# Patient Record
Sex: Male | Born: 1940
Health system: Southern US, Community
[De-identification: ages and names within clinical notes are randomized; demographics above are authoritative.]

## PROBLEM LIST (undated history)

## (undated) DIAGNOSIS — K219 Gastro-esophageal reflux disease without esophagitis: Secondary | ICD-10-CM

## (undated) DIAGNOSIS — Z972 Presence of dental prosthetic device (complete) (partial): Secondary | ICD-10-CM

## (undated) DIAGNOSIS — K635 Polyp of colon: Secondary | ICD-10-CM

## (undated) DIAGNOSIS — R1310 Dysphagia, unspecified: Secondary | ICD-10-CM

## (undated) DIAGNOSIS — K449 Diaphragmatic hernia without obstruction or gangrene: Secondary | ICD-10-CM

## (undated) DIAGNOSIS — N200 Calculus of kidney: Secondary | ICD-10-CM

## (undated) DIAGNOSIS — Z1211 Encounter for screening for malignant neoplasm of colon: Secondary | ICD-10-CM

## (undated) DIAGNOSIS — R1032 Left lower quadrant pain: Secondary | ICD-10-CM

## (undated) DIAGNOSIS — I2510 Atherosclerotic heart disease of native coronary artery without angina pectoris: Secondary | ICD-10-CM

## (undated) DIAGNOSIS — N486 Induration penis plastica: Secondary | ICD-10-CM

## (undated) DIAGNOSIS — R011 Cardiac murmur, unspecified: Secondary | ICD-10-CM

## (undated) DIAGNOSIS — E785 Hyperlipidemia, unspecified: Secondary | ICD-10-CM

## (undated) DIAGNOSIS — M109 Gout, unspecified: Secondary | ICD-10-CM

## (undated) DIAGNOSIS — I10 Essential (primary) hypertension: Secondary | ICD-10-CM

## (undated) DIAGNOSIS — Z87442 Personal history of urinary calculi: Secondary | ICD-10-CM

## (undated) DIAGNOSIS — Z86010 Personal history of colonic polyps: Secondary | ICD-10-CM

## (undated) DIAGNOSIS — H269 Unspecified cataract: Secondary | ICD-10-CM

## (undated) HISTORY — PX: COLONOSCOPY, ESOPHAGOGASTRODUODENOSCOPY (EGD) AND ESOPHAGEAL DILATION: SHX5781

---

## 2005-05-31 ENCOUNTER — Ambulatory Visit: Payer: Self-pay | Admitting: Unknown Physician Specialty

## 2006-11-23 ENCOUNTER — Emergency Department: Payer: Self-pay | Admitting: Emergency Medicine

## 2006-12-17 ENCOUNTER — Ambulatory Visit: Payer: Self-pay | Admitting: Internal Medicine

## 2009-01-09 ENCOUNTER — Ambulatory Visit: Payer: Self-pay | Admitting: Family

## 2009-01-11 ENCOUNTER — Ambulatory Visit: Payer: Self-pay | Admitting: Family

## 2009-01-26 ENCOUNTER — Ambulatory Visit: Payer: Self-pay | Admitting: Urology

## 2009-02-09 ENCOUNTER — Ambulatory Visit: Payer: Self-pay | Admitting: Urology

## 2009-02-21 ENCOUNTER — Ambulatory Visit: Payer: Self-pay | Admitting: Urology

## 2009-11-22 IMAGING — CR DG ABDOMEN 1V
1 series · 2 of 2 positions shown · non-contrast
Comparison: none

REASON FOR EXAM: Nephrolithiasis
COMMENTS:

[Series 1: view not recorded · 0.17mm/px · 2 of 2 slices shown]
[im 1/2]
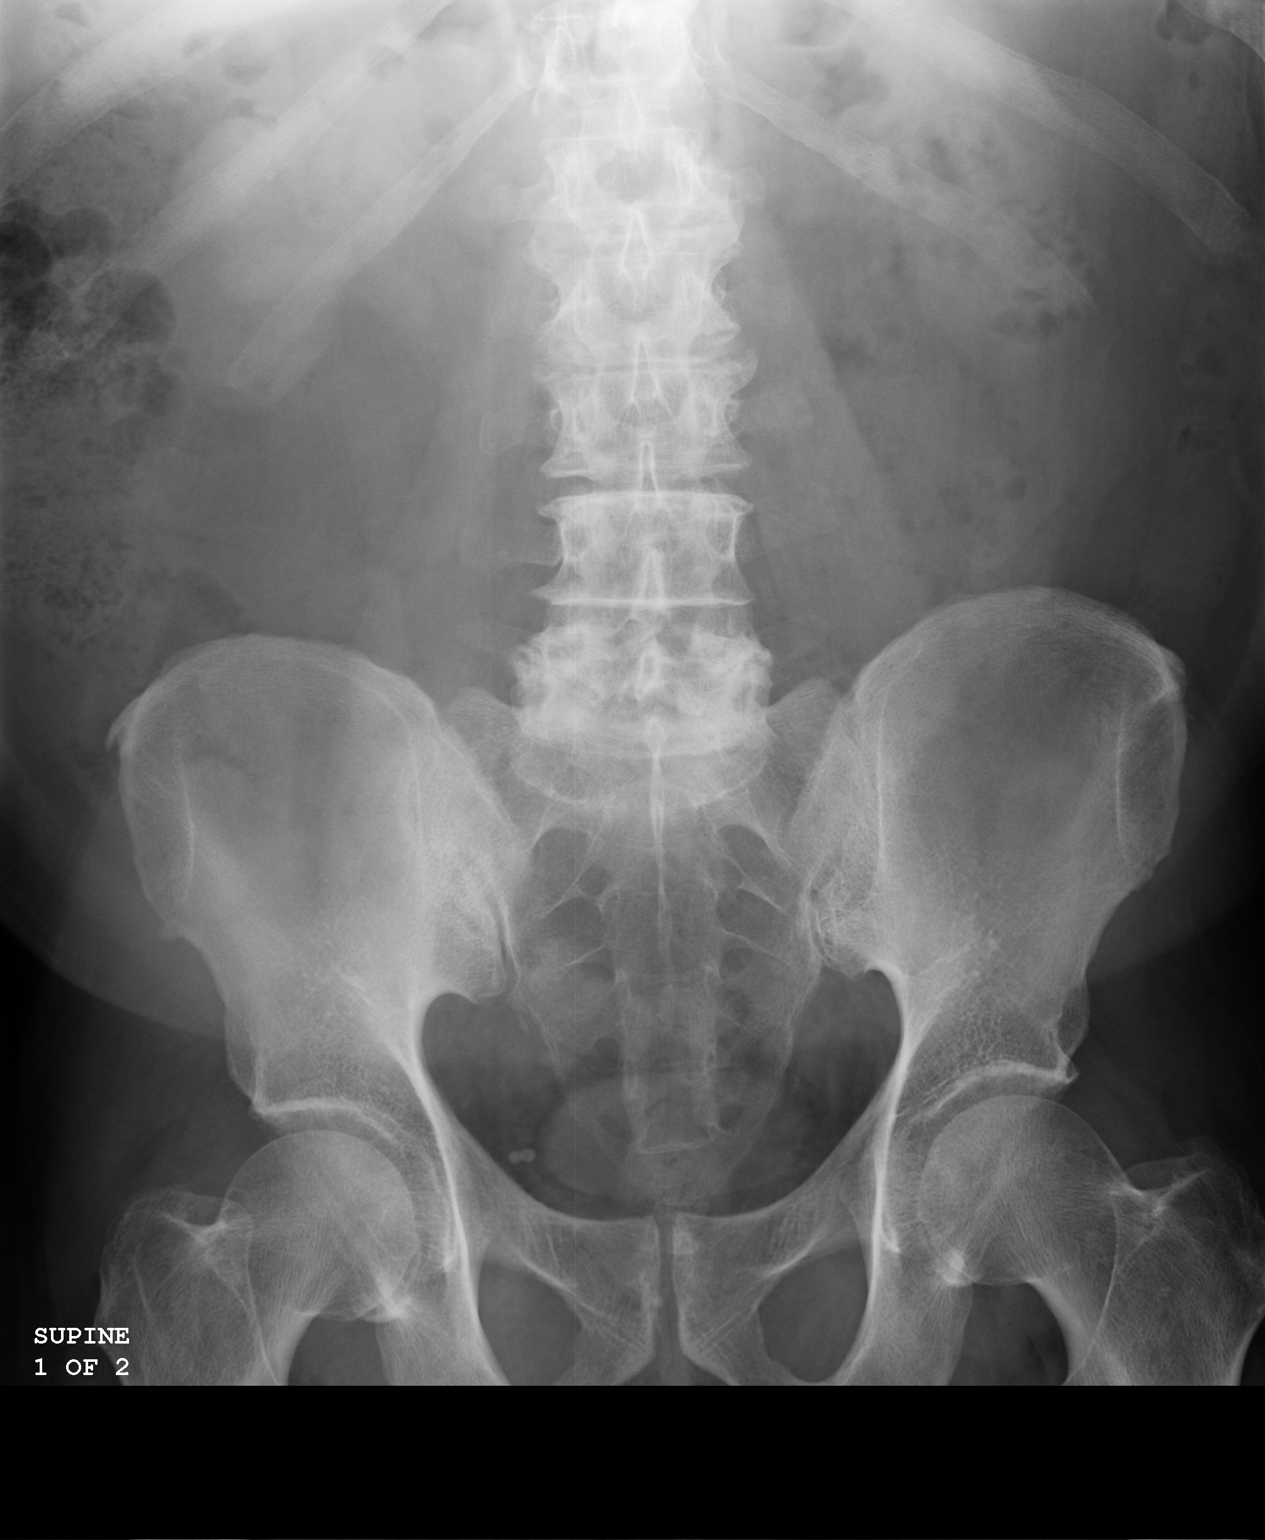
[im 2/2]
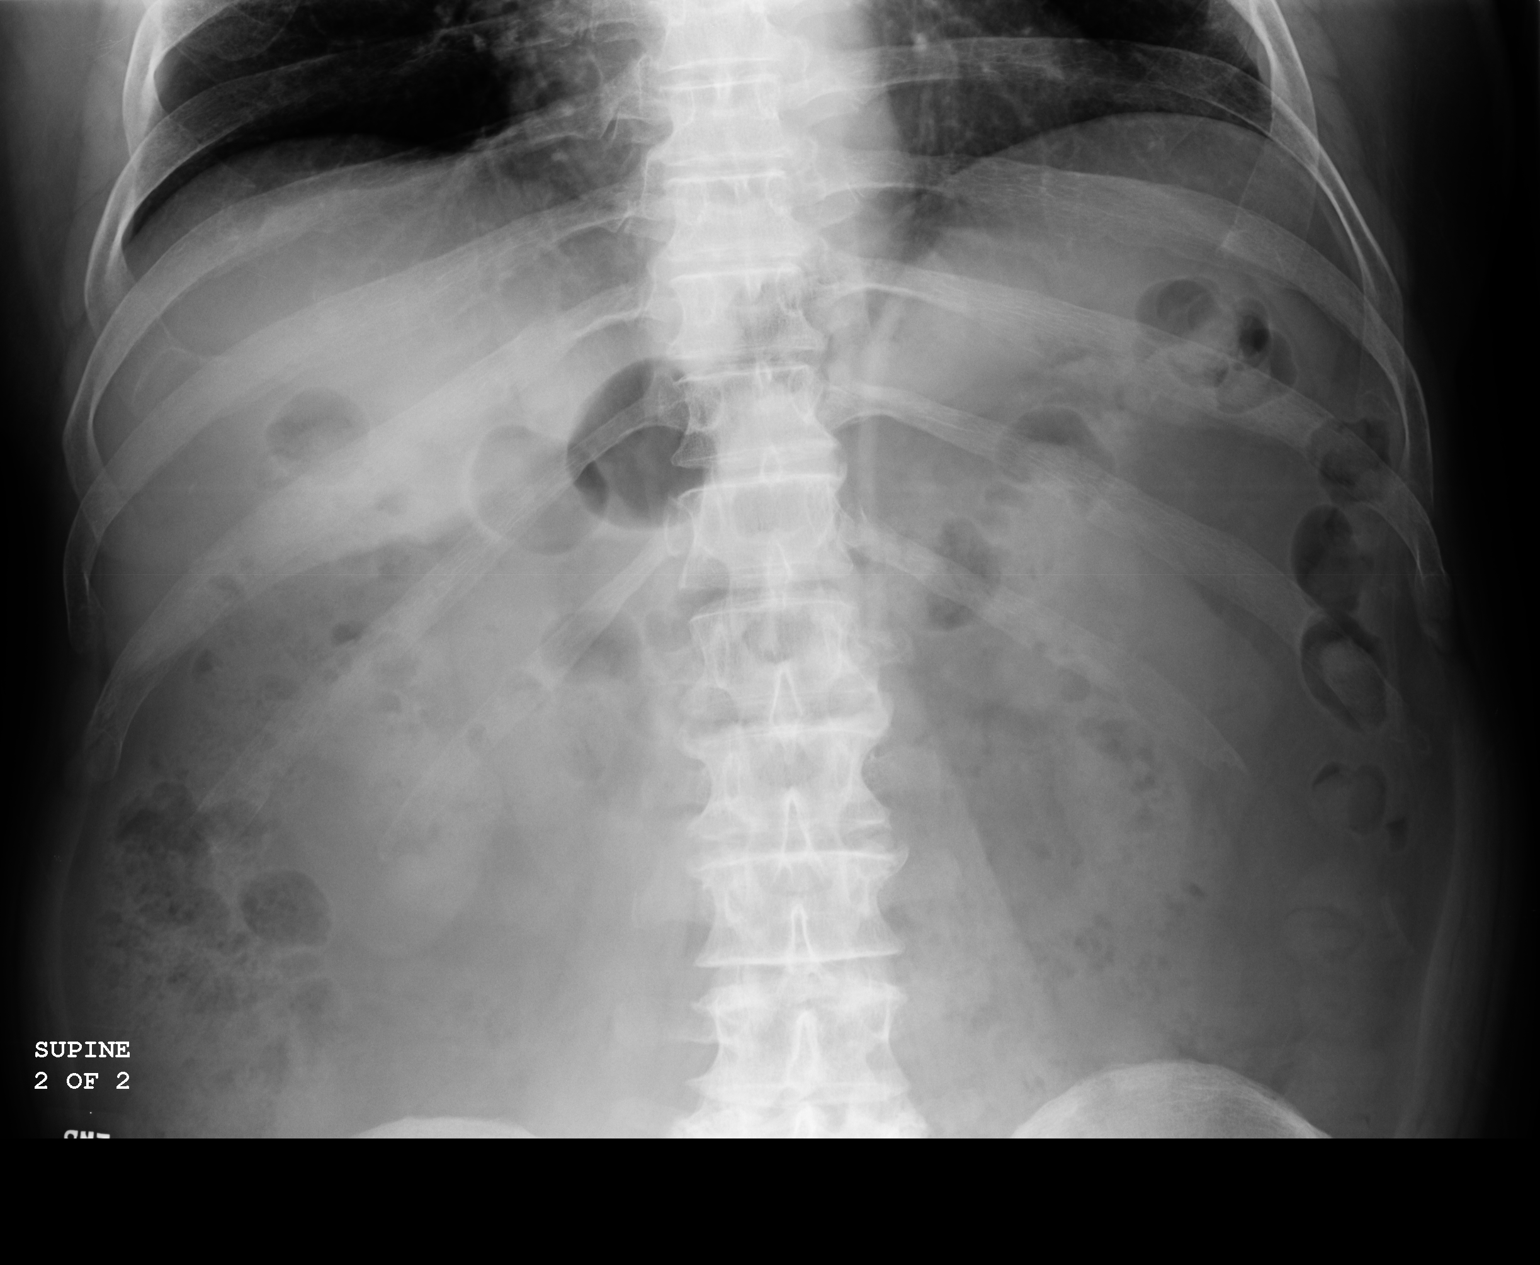

[2 of 2 positions shown; findings below may reference images not displayed]

PROCEDURE:     DXR - DXR KIDNEY URETER BLADDER  - February 09, 2009  [DATE]

RESULT:     Comparison is made to the prior exam of 01/09/2009. There are a
few very faint calcifications projected over the midpole region of the right
kidney and suspicious for right renal stones. The left kidney is in great
part obscured by overlying bowel and bowel content. No densities suspicious
for left renal stones are seen. No ureteral calcifications are identified on
either side.
IMPRESSION: 1.     Probable right nephrolithiasis.

## 2010-04-04 ENCOUNTER — Ambulatory Visit: Payer: Self-pay | Admitting: Unknown Physician Specialty

## 2010-08-24 ENCOUNTER — Ambulatory Visit (INDEPENDENT_AMBULATORY_CARE_PROVIDER_SITE_OTHER): Payer: PRIVATE HEALTH INSURANCE | Admitting: Family Medicine

## 2010-08-24 ENCOUNTER — Encounter: Payer: Self-pay | Admitting: Family Medicine

## 2010-08-24 DIAGNOSIS — I10 Essential (primary) hypertension: Secondary | ICD-10-CM | POA: Insufficient documentation

## 2010-08-24 DIAGNOSIS — D126 Benign neoplasm of colon, unspecified: Secondary | ICD-10-CM | POA: Insufficient documentation

## 2010-08-24 DIAGNOSIS — E785 Hyperlipidemia, unspecified: Secondary | ICD-10-CM | POA: Insufficient documentation

## 2010-08-27 ENCOUNTER — Encounter: Payer: Self-pay | Admitting: Family Medicine

## 2010-08-30 NOTE — Assessment & Plan Note (Signed)
Summary: BLOOD WORK   Vital Signs:  Chad Park Profile:   70 Years Old Male CC:      Get Cholesterol Checked O2 Sat:      95 % O2 treatment:    Room Air Temp:     97.6 degrees F oral Pulse rate:   97 / minute Pulse rhythm:   regular Resp:     13 per minute BP sitting:   165 / 110  (left arm)  Pt. in pain?   no                   Current Allergies (reviewed today): No known allergies History of Present Illness History from: Chad Park Chief Complaint: Get Cholesterol Checked History of Present Illness: The Chad Park presented today with his wife to get his cholesterol checked.  He says he stopped his cholesterol medications about 4 to 5 months ago because he stopped seeing his PCP and could no longer get refills.  He says that he became frustrated with the paperwork required to see his PCP. He says that he can't read or write well and he has difficulty and gets frustrated with all of the paperwork.  He says he just won't do it.  Unfortunately he stopped treating his BP and his cholesterol.  He is having headaches but reports no CP or SOB.  He is still active as a Visual merchandiser.  He is reporting that he recently had a colonoscopy.  He has had 4 done total because of recurrent precancerous polyps that have been removed.     REVIEW OF SYSTEMS Constitutional Symptoms      Denies fever, chills, night sweats, weight loss, weight gain, and fatigue.  Eyes       Complains of glasses.      Denies change in vision, eye pain, eye discharge, contact lenses, and eye surgery. Ear/Nose/Throat/Mouth       Denies hearing loss/aids, change in hearing, ear pain, ear discharge, dizziness, frequent runny nose, frequent nose bleeds, sinus problems, sore throat, hoarseness, and tooth pain or bleeding.  Respiratory       Denies dry cough, productive cough, wheezing, shortness of breath, asthma, bronchitis, and emphysema/COPD.  Cardiovascular       Denies murmurs, chest pain, and tires easily with exhertion.     Gastrointestinal       Denies stomach pain, nausea/vomiting, diarrhea, constipation, blood in bowel movements, and indigestion. Genitourniary       Denies painful urination, kidney stones, and loss of urinary control. Neurological       Denies paralysis, seizures, and fainting/blackouts. Musculoskeletal       Denies muscle pain, joint pain, joint stiffness, decreased range of motion, redness, swelling, muscle weakness, and gout.  Skin       Denies bruising, unusual mles/lumps or sores, and hair/skin or nail changes.  Psych       Denies mood changes, temper/anger issues, anxiety/stress, speech problems, depression, and sleep problems.  Past History:  Past Medical History: HTN Dyslipidemia Recurrent precancerous colon polyps - most recent colonoscopy 2012 Chronic Active Nicotine Dependence  Past Surgical History: 4 colonoscopy procedures  Family History: Mother - D - Blood Clot Father - D - Lung Cancer Brother - D - Suicide  Sister - D - Accidental overdose sleeping pills  Social History: Pt stopped smoking tobacco 34 years ago but has been dipping tobacco consistently for the last 34 years. Denies ETOH and Recreational Drugs.  Pt is a lifelong Engineer, maintenance.  Physical Exam  General appearance: well developed, well nourished, no acute distress Head: normocephalic, atraumatic Eyes: conjunctivae and lids normal Pupils: equal, round, reactive to light Ears: normal, no lesions or deformities Nasal: swollen red turbinates with congestion Oral/Pharynx: pharyngeal erythema without exudate, uvula midline without deviation Neck: neck supple,  trachea midline, no masses Chest/Lungs: no rales, wheezes, or rhonchi bilateral, breath sounds equal without effort Heart: regular rate and  rhythm, no murmur Extremities: normal extremities Neurological: grossly intact and non-focal Skin: no obvious rashes or lesions MSE: oriented to time, place, and person Assessment Problems:   New  Problems: DYSLIPIDEMIA (ICD-272.4) UNSPECIFIED ESSENTIAL HYPERTENSION (ICD-401.9) COLONIC POLYPS, RECURRENT (ICD-211.3)   Chad Park Education: The risks, benefits and possible side effects were clearly explained and discussed with the Chad Park.  The Chad Park verbalized clear understanding.  The Chad Park was given instructions to return if symptoms don't improve, worsen or new changes develop.  If it is not during clinic hours and the Chad Park cannot get back to this clinic then the Chad Park was told to seek medical care at an available urgent care or emergency department.  The Chad Park verbalized understanding.   Demonstrates willingness to comply.  Plan New Medications/Changes: LISINOPRIL-HYDROCHLOROTHIAZIDE 10-12.5 MG TABS (LISINOPRIL-HYDROCHLOROTHIAZIDE) take 1 by mouth daily for blood pressure  #30 x 1, 08/24/2010, Jazlynn Nemetz MD SIMVASTATIN 40 MG TABS (SIMVASTATIN) take 1 by mouth at bedtime for cholesterol  #30 x 1, 08/24/2010, Dvid Pendry MD  Planning Comments:   Go to [facility] to have your blood work done.     Follow Up: Follow up with Primary Physician Follow Up: BP recheck in 2 weeks  The Chad Park and/or caregiver has been counseled thoroughly with regard to medications prescribed including dosage, schedule, interactions, rationale for use, and possible side effects and they verbalize understanding.  Diagnoses and expected course of recovery discussed and will return if not improved as expected or if the condition worsens. Chad Park and/or caregiver verbalized understanding.  Prescriptions: LISINOPRIL-HYDROCHLOROTHIAZIDE 10-12.5 MG TABS (LISINOPRIL-HYDROCHLOROTHIAZIDE) take 1 by mouth daily for blood pressure  #30 x 1   Entered and Authorized by:   Standley Dakins MD   Signed by:   Standley Dakins MD on 08/24/2010   Method used:   Electronically to        [facility] [address]* (retail)       [address] [place], [place]  [address]        Ph: [phone]       Fax: [phone]   RxID:   [id number] SIMVASTATIN 40 MG TABS (SIMVASTATIN) take 1 by mouth at bedtime for cholesterol  #30 x 1   Entered and Authorized by:   Standley Dakins MD   Signed by:   Standley Dakins MD on 08/24/2010   Method used:   Electronically to        [facility] [address] (retail)       [address] [place], [place]  [address]       Ph: [phone]       Fax: [phone]   RxID:   [id number]   Chad Park Instructions: 1)  Go to the pharmacy and pick up your prescription (s).  It may take up to 30 mins for electronic prescriptions to be delivered to the pharmacy.  Please call if your pharmacy has not received your prescriptions after  30 minutes.   2)  Check your Blood Pressure regularly. If it is above: 140/90 you should make an appointment. 3)  Go to [facility] to have your labwork done.  Please call our office if you haven't heard anything after 3 days.  4)  Return or go to the ER if no improvement or symptoms getting worse.  5)  The Chad Park was informed that there is no on-call provider or services available at this clinic during off-hours (when the clinic is closed).  If the Chad Park developed a problem or concern that required immediate attention, the Chad Park was advised to go the the nearest available urgent care or emergency department for medical care.  The Chad Park verbalized understanding.

## 2010-09-19 ENCOUNTER — Encounter: Payer: Self-pay | Admitting: Internal Medicine

## 2010-09-19 ENCOUNTER — Ambulatory Visit (INDEPENDENT_AMBULATORY_CARE_PROVIDER_SITE_OTHER): Payer: PRIVATE HEALTH INSURANCE | Admitting: Internal Medicine

## 2010-09-19 DIAGNOSIS — M109 Gout, unspecified: Secondary | ICD-10-CM

## 2010-09-19 DIAGNOSIS — I10 Essential (primary) hypertension: Secondary | ICD-10-CM

## 2010-09-25 NOTE — Letter (Signed)
Summary: History Form  History Form   Imported By: Eugenio Hoes 09/19/2010 15:00:40  _____________________________________________________________________  External Attachment:    Type:   Image     Comment:   External Document

## 2010-09-25 NOTE — Assessment & Plan Note (Signed)
Summary: GOUT IN LEFT BIG TOE   Vital Signs:  Patient Profile:   70 Years Old Male CC:      gout x 2 weeks Temp:     98.2 degrees F Pulse rate:   88 / minute Resp:     14 per minute BP sitting:   168 / 94  (left arm)                  Current Allergies: No known allergies History of Present Illness Chief Complaint: gout x 2 weeks History of Present Illness: Had first gout attack 5 yrs ago and gets them 1-2/yr. Never had aspiration or lab workup. Had sudden onset of left mpj pain which has waxed and waned on ibup 1000 mg two times a day . Thinks he drinks plenty but urine has been bright yellow.  Denies trauma, f/c. Stopped blood pressure med when he got a little dizzy few weeks ago.  REVIEW OF SYSTEMS Constitutional Symptoms      Denies fever, chills, night sweats, weight loss, weight gain, and fatigue.  Eyes       Denies change in vision, eye pain, eye discharge, glasses, contact lenses, and eye surgery. Ear/Nose/Throat/Mouth       Denies hearing loss/aids, change in hearing, ear pain, ear discharge, dizziness, frequent runny nose, frequent nose bleeds, sinus problems, sore throat, hoarseness, and tooth pain or bleeding.  Respiratory       Denies dry cough, productive cough, wheezing, shortness of breath, asthma, bronchitis, and emphysema/COPD.  Cardiovascular       Denies murmurs, chest pain, and tires easily with exhertion.    Gastrointestinal       Denies stomach pain, nausea/vomiting, diarrhea, constipation, blood in bowel movements, and indigestion. Genitourniary       Denies painful urination, kidney stones, and loss of urinary control. Neurological       Denies paralysis, seizures, and fainting/blackouts. Musculoskeletal       Complains of gout.      Denies muscle pain, joint pain, joint stiffness, decreased range of motion, redness, swelling, and muscle weakness.  Skin       Denies bruising, unusual mles/lumps or sores, and hair/skin or nail changes.  Psych  Denies mood changes, temper/anger issues, anxiety/stress, speech problems, depression, and sleep problems.  Past History:  Past Surgical History: Last updated: 08/24/2010 4 colonoscopy procedures  Family History: Last updated: 08/24/2010 Mother - D - Blood Clot Father - D - Lung Cancer Brother - D - Suicide  Sister - D - Accidental overdose sleeping pills  Social History: Last updated: 08/24/2010 Pt stopped smoking tobacco 34 years ago but has been dipping tobacco consistently for the last 34 years. Denies ETOH and Recreational Drugs.  Pt is a lifelong Engineer, maintenance.   Past Medical History: HTN Dyslipidemia Recurrent precancerous colon polyps - most recent colonoscopy 2012 Chronic Active Nicotine Dependence kidney stones Physical Exam General appearance: well developed, well nourished, no acute distress Head: normocephalic, atraumatic Eyes: conjunctivae and lids normal Extremities: left foot, 1st mpj with mild to mod lateral redness and swelling but very tender at joint line Neurological: grossly intact and non-focal Skin: no obvious rashe MSE: oriented to time, place, and person Assessment New Problems: HYPERTENSION (ICD-401.1) GOUT (ICD-274.9)   Plan New Medications/Changes: INDOMETHACIN 25 MG CAPS (INDOMETHACIN) 1-2 three times a day pc as needed gout  #40 x 0, 09/19/2010, J. Juline Patch MD   The patient and/or caregiver has been counseled thoroughly with regard to  medications prescribed including dosage, schedule, interactions, rationale for use, and possible side effects and they verbalize understanding.  Diagnoses and expected course of recovery discussed and will return if not improved as expected or if the condition worsens. Patient and/or caregiver verbalized understanding.  Prescriptions: INDOMETHACIN 25 MG CAPS (INDOMETHACIN) 1-2 three times a day pc as needed gout  #40 x 0   Entered and Authorized by:   J. Juline Patch MD   Signed by:   Shela Commons. Juline Patch  MD on 09/19/2010   Method used:   Electronically to        [facility] [address]* (retail)       [address] [place], [place]  [address]       Ph: [phone]       Fax: [phone]   RxID:   [id number]   Patient Instructions: 1)  stop ibuprofen. 2)  take 2 tabs of indomethacin with meals at the beginning, later reduce the dose as you improve. drink alot of water. 3)  Be mindful of your stomach and bm's. 4)  restart you blood pressure med. 5)  call [phone] to get a new family MD since we're closing today. 6)  Use Dr. Harl Bowie until your new MD

## 2010-09-25 NOTE — Letter (Signed)
Summary: History Form  History Form   Imported By: Eugenio Hoes 09/17/2010 13:22:23  _____________________________________________________________________  External Attachment:    Type:   Image     Comment:   External Document

## 2015-03-21 ENCOUNTER — Encounter: Payer: Self-pay | Admitting: *Deleted

## 2015-03-22 ENCOUNTER — Ambulatory Visit: Payer: Commercial Managed Care - HMO | Admitting: Anesthesiology

## 2015-03-22 ENCOUNTER — Encounter
Admission: RE | Disposition: A | Discharge status: Home / Self Care | Payer: Self-pay | Source: Ambulatory Visit | Attending: Unknown Physician Specialty

## 2015-03-22 ENCOUNTER — Ambulatory Visit
Admission: RE | Admit: 2015-03-22 | Discharge: 2015-03-22 | Disposition: A | Discharge status: Home / Self Care | Payer: Commercial Managed Care - HMO | Source: Ambulatory Visit | Attending: Unknown Physician Specialty | Admitting: Unknown Physician Specialty

## 2015-03-22 ENCOUNTER — Encounter: Payer: Self-pay | Admitting: *Deleted

## 2015-03-22 DIAGNOSIS — K640 First degree hemorrhoids: Secondary | ICD-10-CM | POA: Insufficient documentation

## 2015-03-22 DIAGNOSIS — Z87442 Personal history of urinary calculi: Secondary | ICD-10-CM | POA: Insufficient documentation

## 2015-03-22 DIAGNOSIS — K635 Polyp of colon: Secondary | ICD-10-CM | POA: Insufficient documentation

## 2015-03-22 DIAGNOSIS — Z87891 Personal history of nicotine dependence: Secondary | ICD-10-CM | POA: Diagnosis not present

## 2015-03-22 DIAGNOSIS — K319 Disease of stomach and duodenum, unspecified: Secondary | ICD-10-CM | POA: Insufficient documentation

## 2015-03-22 DIAGNOSIS — E785 Hyperlipidemia, unspecified: Secondary | ICD-10-CM | POA: Insufficient documentation

## 2015-03-22 DIAGNOSIS — D122 Benign neoplasm of ascending colon: Secondary | ICD-10-CM | POA: Insufficient documentation

## 2015-03-22 DIAGNOSIS — K449 Diaphragmatic hernia without obstruction or gangrene: Secondary | ICD-10-CM | POA: Diagnosis not present

## 2015-03-22 DIAGNOSIS — D124 Benign neoplasm of descending colon: Secondary | ICD-10-CM | POA: Diagnosis not present

## 2015-03-22 DIAGNOSIS — R1310 Dysphagia, unspecified: Secondary | ICD-10-CM | POA: Diagnosis present

## 2015-03-22 DIAGNOSIS — Z7982 Long term (current) use of aspirin: Secondary | ICD-10-CM | POA: Insufficient documentation

## 2015-03-22 DIAGNOSIS — D123 Benign neoplasm of transverse colon: Secondary | ICD-10-CM | POA: Insufficient documentation

## 2015-03-22 DIAGNOSIS — K222 Esophageal obstruction: Secondary | ICD-10-CM | POA: Insufficient documentation

## 2015-03-22 DIAGNOSIS — Z86010 Personal history of colonic polyps: Secondary | ICD-10-CM | POA: Insufficient documentation

## 2015-03-22 DIAGNOSIS — K317 Polyp of stomach and duodenum: Secondary | ICD-10-CM | POA: Diagnosis not present

## 2015-03-22 DIAGNOSIS — Z79899 Other long term (current) drug therapy: Secondary | ICD-10-CM | POA: Diagnosis not present

## 2015-03-22 DIAGNOSIS — K219 Gastro-esophageal reflux disease without esophagitis: Secondary | ICD-10-CM | POA: Insufficient documentation

## 2015-03-22 DIAGNOSIS — K621 Rectal polyp: Secondary | ICD-10-CM | POA: Diagnosis not present

## 2015-03-22 DIAGNOSIS — K2950 Unspecified chronic gastritis without bleeding: Secondary | ICD-10-CM | POA: Insufficient documentation

## 2015-03-22 HISTORY — DX: Calculus of kidney: N20.0

## 2015-03-22 HISTORY — PX: ESOPHAGOGASTRODUODENOSCOPY (EGD) WITH PROPOFOL: SHX5813

## 2015-03-22 HISTORY — DX: Diaphragmatic hernia without obstruction or gangrene: K44.9

## 2015-03-22 HISTORY — DX: Gastro-esophageal reflux disease without esophagitis: K21.9

## 2015-03-22 HISTORY — DX: Induration penis plastica: N48.6

## 2015-03-22 HISTORY — PX: COLONOSCOPY WITH PROPOFOL: SHX5780

## 2015-03-22 HISTORY — DX: Dysphagia, unspecified: R13.10

## 2015-03-22 HISTORY — DX: Hyperlipidemia, unspecified: E78.5

## 2015-03-22 HISTORY — DX: Polyp of colon: K63.5

## 2015-03-22 HISTORY — DX: Unspecified cataract: H26.9

## 2015-03-22 HISTORY — DX: Gout, unspecified: M10.9

## 2015-03-22 SURGERY — COLONOSCOPY WITH PROPOFOL
Anesthesia: General

## 2015-03-22 MED ORDER — PROPOFOL 500 MG/50ML IV EMUL
INTRAVENOUS | Status: DC | PRN
  Administered 2015-03-22: 100 ug/kg/min via INTRAVENOUS

## 2015-03-22 MED ORDER — PROPOFOL 10 MG/ML IV BOLUS
INTRAVENOUS | Status: DC | PRN
  Administered 2015-03-22: 100 mg via INTRAVENOUS

## 2015-03-22 MED ORDER — SODIUM CHLORIDE 0.9 % IV SOLN
INTRAVENOUS | Status: DC
  Administered 2015-03-22: 13:00:00 via INTRAVENOUS
  Administered 2015-03-22: 1000 mL via INTRAVENOUS

## 2015-03-22 NOTE — Op Note (Signed)
[facility] Gastroenterology Patient Name: Chad Park Procedure Date: 03/22/2015 1:28 PM MRN: [id number] Account #: [id number] Date of Birth: Jan 21, 1941 Admit Type: Outpatient Age: 74 Room: [facility] ENDO ROOM 4 Gender: Male Note Status: Finalized Procedure:         Upper GI endoscopy Indications:       Dysphagia Providers:         Manya Silvas, MD Referring MD:      Irven Easterly. Kary Kos, MD (Referring MD) Medicines:         Propofol per Anesthesia Complications:     No immediate complications. Procedure:         Pre-Anesthesia Assessment:                    - After reviewing the risks and benefits, the patient was                     deemed in satisfactory condition to undergo the procedure.                    After obtaining informed consent, the endoscope was passed                     under direct vision. Throughout the procedure, the                     patient's blood pressure, pulse, and oxygen saturations                     were monitored continuously. The Endoscope was introduced                     through the mouth, and advanced to the second part of                     duodenum. The upper GI endoscopy was accomplished without                     difficulty. The patient tolerated the procedure well. Findings:      A mild Schatzki ring (acquired) was found at the gastroesophageal       junction. A guidewire was placed and the scope was withdrawn. Dilation       was performed with a Savary dilator with no resistance at 17 mm.      A few dispersed, small non-bleeding erosions were found in the gastric       antrum. There were stigmata of recent bleeding. Biopsies were taken with       a cold forceps for histology. Biopsies were taken with a cold forceps       for Helicobacter pylori testing. Harmless gastric polyps.      The examined duodenum was normal. Impression:        - Mild Schatzki ring. Dilated.                    - Recently bleeding erosive  gastropathy. Biopsied.                    - Normal examined duodenum. Recommendation:    - Await pathology results. Manya Silvas, MD 03/22/2015 1:41:46 PM This report has been signed electronically. Number of Addenda: 0 Note Initiated On: 03/22/2015 1:28 PM      [facility]

## 2015-03-22 NOTE — Anesthesia Postprocedure Evaluation (Signed)
  Anesthesia Post-op Note  Patient: Chad Park  Procedure(s) Performed: Procedure(s): COLONOSCOPY WITH PROPOFOL (N/A) ESOPHAGOGASTRODUODENOSCOPY (EGD) WITH PROPOFOL (N/A)  Anesthesia type:General  Patient location: PACU  Post pain: Pain level controlled  Post assessment: Post-op Vital signs reviewed, Patient's Cardiovascular Status Stable, Respiratory Function Stable, Patent Airway and No signs of Nausea or vomiting  Post vital signs: Reviewed and stable  Last Vitals:  Filed Vitals:   03/22/15 1445  BP: 111/96  Pulse: 78  Temp:   Resp: 18    Level of consciousness: awake, alert  and patient cooperative  Complications: No apparent anesthesia complications

## 2015-03-22 NOTE — H&P (Signed)
   Primary Care Physician:  [place]land Pink, MD Primary Gastroenterologist:  Dr. Vira Agar  Pre-Procedure History & Physical: HPI:  Chad Park is a 74 y.o. male is here for an endoscopy and colonoscopy.   Past Medical History  Diagnosis Date  . Cataract Left Eye  . Colon polyps   . Dysphagia   . GERD (gastroesophageal reflux disease)   . Gout   . Hiatal hernia   . Hyperlipidemia   . Kidney stones   . Peyronie's disease     Past Surgical History  Procedure Laterality Date  . Colonoscopy, esophagogastroduodenoscopy (egd) and esophageal dilation      Prior to Admission medications   Medication Sig Start Date End Date Taking? Authorizing Provider  aspirin 81 MG tablet Take 81 mg by mouth daily.   Yes Historical Provider, MD  omeprazole (PRILOSEC) 20 MG capsule Take 20 mg by mouth daily.   Yes Historical Provider, MD  pravastatin (PRAVACHOL) 40 MG tablet Take 40 mg by mouth daily.   Yes Historical Provider, MD    Allergies as of 03/06/2015  . (Not on File)    History reviewed. No pertinent family history.  Social History   Social History  . Marital Status: Married    Spouse Name: N/A  . Number of Children: N/A  . Years of Education: N/A   Occupational History  . Not on file.   Social History Main Topics  . Smoking status: Former Research scientist (life sciences)  . Smokeless tobacco: Not on file  . Alcohol Use: 1.2 - 1.8 oz/week    2-3 Cans of beer per week  . Drug Use: Not on file  . Sexual Activity: Not on file   Other Topics Concern  . Not on file   Social History Narrative    Review of Systems: See HPI, otherwise negative ROS  Physical Exam: Pulse 96  Temp(Src) 98.4 F (36.9 C) (Oral)  Resp 20  Ht 5\' 5"  (1.651 m)  Wt 69.4 kg (153 lb)  BMI 25.46 kg/m2 General:   Alert,  pleasant and cooperative in NAD Head:  Normocephalic and atraumatic. Neck:  Supple; no masses or thyromegaly. Lungs:  Clear throughout to auscultation.    Heart:  Regular rate and rhythm. Abdomen:   Soft, nontender and nondistended. Normal bowel sounds, without guarding, and without rebound.   Neurologic:  Alert and  oriented x4;  grossly normal neurologically.  Impression/Plan: Chad Park is here for an endoscopy and colonoscopy to be performed for dysphagia and PH adenomatous polyps  Risks, benefits, limitations, and alternatives regarding  endoscopy and colonoscopy have been reviewed with the patient.  Questions have been answered.  All parties agreeable.   Gaylyn Cheers, MD  03/22/2015, 1:28 PM

## 2015-03-22 NOTE — Op Note (Signed)
[facility] Gastroenterology Patient Name: Chad Park Procedure Date: 03/22/2015 1:27 PM MRN: [id number] Account #: [id number] Date of Birth: [birth year] Admit Type: Outpatient Age: 74 Room: [facility] ENDO ROOM 4 Gender: Male Note Status: Finalized Procedure:         Colonoscopy Indications:       Personal history of colonic polyps Providers:         Manya Silvas, MD Referring MD:      Irven Easterly. Kary Kos, MD (Referring MD) Medicines:         Propofol per Anesthesia Complications:     No immediate complications. Procedure:         Pre-Anesthesia Assessment:                    - After reviewing the risks and benefits, the patient was                     deemed in satisfactory condition to undergo the procedure.                    After obtaining informed consent, the colonoscope was                     passed under direct vision. Throughout the procedure, the                     patient's blood pressure, pulse, and oxygen saturations                     were monitored continuously. The Colonoscope was                     introduced through the anus and advanced to the the cecum,                     identified by appendiceal orifice and ileocecal valve. The                     colonoscopy was performed without difficulty. The patient                     tolerated the procedure well. The quality of the bowel                     preparation was good. Findings:      Two sessile polyps were found in the transverse colon. The polyps were       small in size. These polyps were removed with a hot snare. Resection and       retrieval were complete. One clip applied to the site on one of them.      Two sessile polyps were found in the transverse colon. The polyps were       diminutive in size. These polyps were removed with a jumbo cold forceps.       Resection and retrieval were complete.      A diminutive polyp was found in the descending colon. The polyp was       sessile.  The polyp was removed with a jumbo cold forceps. Resection and       retrieval were complete.      A diminutive polyp was found in the rectum. The polyp was sessile. The       polyp was removed with a jumbo cold forceps. Resection and retrieval  were complete.      Internal hemorrhoids were found during endoscopy. The hemorrhoids were       small and Grade I (internal hemorrhoids that do not prolapse). Impression:        - Two small polyps in the transverse colon. Resected and                     retrieved.                    - Two diminutive polyps in the transverse colon. Resected                     and retrieved.                    - One diminutive polyp in the descending colon. Resected                     and retrieved.                    - One diminutive polyp in the rectum. Resected and                     retrieved.                    - Internal hemorrhoids. Recommendation:    - Await pathology results. Manya Silvas, MD 03/22/2015 2:13:46 PM This report has been signed electronically. Number of Addenda: 0 Note Initiated On: 03/22/2015 1:27 PM Scope Withdrawal Time: 0 hours 19 minutes 58 seconds  Total Procedure Duration: 0 hours 25 minutes 52 seconds       [facility]

## 2015-03-22 NOTE — Transfer of Care (Signed)
Immediate Anesthesia Transfer of Care Note  Patient: Chad Park  Procedure(s) Performed: Procedure(s): COLONOSCOPY WITH PROPOFOL (N/A) ESOPHAGOGASTRODUODENOSCOPY (EGD) WITH PROPOFOL (N/A)  Patient Location: PACU  Anesthesia Type:General  Level of Consciousness: sedated  Airway & Oxygen Therapy: Patient Spontanous Breathing and Patient connected to nasal cannula oxygen  Post-op Assessment: Report given to RN and Post -op Vital signs reviewed and stable  Post vital signs: stable  Last Vitals:  Filed Vitals:   03/22/15 1252  Pulse: 96  Temp: 36.9 C  Resp: 20    Complications: No apparent anesthesia complications

## 2015-03-22 NOTE — Anesthesia Preprocedure Evaluation (Signed)
Anesthesia Evaluation  Patient identified by MRN, date of birth, ID band Patient awake    Reviewed: Allergy & Precautions, H&P , NPO status , Patient's Chart, lab work & pertinent test results, reviewed documented beta blocker date and time   Airway Mallampati: II  TM Distance: >3 FB Neck ROM: full    Dental no notable dental hx.    Pulmonary neg pulmonary ROS,    Pulmonary exam normal breath sounds clear to auscultation       Cardiovascular Exercise Tolerance: Good hypertension, negative cardio ROS   Rhythm:regular Rate:Normal     Neuro/Psych  Neuromuscular disease negative neurological ROS  negative psych ROS   GI/Hepatic negative GI ROS, Neg liver ROS, hiatal hernia, GERD  ,  Endo/Other  negative endocrine ROS  Renal/GU negative Renal ROS  negative genitourinary   Musculoskeletal   Abdominal   Peds  Hematology negative hematology ROS (+)   Anesthesia Other Findings   Reproductive/Obstetrics negative OB ROS                             Anesthesia Physical Anesthesia Plan  ASA: III  Anesthesia Plan: General   Post-op Pain Management:    Induction:   Airway Management Planned:   Additional Equipment:   Intra-op Plan:   Post-operative Plan:   Informed Consent: I have reviewed the patients History and Physical, chart, labs and discussed the procedure including the risks, benefits and alternatives for the proposed anesthesia with the patient or authorized representative who has indicated his/her understanding and acceptance.   Dental Advisory Given  Plan Discussed with: CRNA  Anesthesia Plan Comments:         Anesthesia Quick Evaluation

## 2015-03-23 ENCOUNTER — Encounter: Payer: Self-pay | Admitting: Unknown Physician Specialty

## 2015-03-24 LAB — SURGICAL PATHOLOGY

## 2015-07-26 DIAGNOSIS — H25813 Combined forms of age-related cataract, bilateral: Secondary | ICD-10-CM | POA: Diagnosis not present

## 2015-08-18 DIAGNOSIS — H02839 Dermatochalasis of unspecified eye, unspecified eyelid: Secondary | ICD-10-CM | POA: Diagnosis not present

## 2015-08-18 DIAGNOSIS — H18413 Arcus senilis, bilateral: Secondary | ICD-10-CM | POA: Diagnosis not present

## 2015-08-18 DIAGNOSIS — H401131 Primary open-angle glaucoma, bilateral, mild stage: Secondary | ICD-10-CM | POA: Diagnosis not present

## 2015-08-18 DIAGNOSIS — H2513 Age-related nuclear cataract, bilateral: Secondary | ICD-10-CM | POA: Diagnosis not present

## 2015-08-30 DIAGNOSIS — H2511 Age-related nuclear cataract, right eye: Secondary | ICD-10-CM | POA: Diagnosis not present

## 2015-08-30 DIAGNOSIS — H2513 Age-related nuclear cataract, bilateral: Secondary | ICD-10-CM | POA: Diagnosis not present

## 2015-09-11 DIAGNOSIS — H25811 Combined forms of age-related cataract, right eye: Secondary | ICD-10-CM | POA: Diagnosis not present

## 2015-09-11 DIAGNOSIS — Z961 Presence of intraocular lens: Secondary | ICD-10-CM | POA: Diagnosis not present

## 2015-09-11 DIAGNOSIS — H2511 Age-related nuclear cataract, right eye: Secondary | ICD-10-CM | POA: Diagnosis not present

## 2015-09-25 DIAGNOSIS — H2512 Age-related nuclear cataract, left eye: Secondary | ICD-10-CM | POA: Diagnosis not present

## 2015-09-25 DIAGNOSIS — Z961 Presence of intraocular lens: Secondary | ICD-10-CM | POA: Diagnosis not present

## 2015-09-25 DIAGNOSIS — H25812 Combined forms of age-related cataract, left eye: Secondary | ICD-10-CM | POA: Diagnosis not present

## 2015-10-31 DIAGNOSIS — E785 Hyperlipidemia, unspecified: Secondary | ICD-10-CM | POA: Diagnosis not present

## 2015-10-31 DIAGNOSIS — M10072 Idiopathic gout, left ankle and foot: Secondary | ICD-10-CM | POA: Diagnosis not present

## 2015-10-31 DIAGNOSIS — Z125 Encounter for screening for malignant neoplasm of prostate: Secondary | ICD-10-CM | POA: Diagnosis not present

## 2015-10-31 DIAGNOSIS — Z0000 Encounter for general adult medical examination without abnormal findings: Secondary | ICD-10-CM | POA: Diagnosis not present

## 2015-11-06 DIAGNOSIS — M10072 Idiopathic gout, left ankle and foot: Secondary | ICD-10-CM | POA: Diagnosis not present

## 2015-11-06 DIAGNOSIS — Z125 Encounter for screening for malignant neoplasm of prostate: Secondary | ICD-10-CM | POA: Diagnosis not present

## 2015-11-06 DIAGNOSIS — E785 Hyperlipidemia, unspecified: Secondary | ICD-10-CM | POA: Diagnosis not present

## 2015-11-06 DIAGNOSIS — Z0000 Encounter for general adult medical examination without abnormal findings: Secondary | ICD-10-CM | POA: Diagnosis not present

## 2015-11-16 DIAGNOSIS — H04569 Stenosis of unspecified lacrimal punctum: Secondary | ICD-10-CM | POA: Diagnosis not present

## 2015-11-21 DIAGNOSIS — H04569 Stenosis of unspecified lacrimal punctum: Secondary | ICD-10-CM | POA: Diagnosis not present

## 2016-01-04 DIAGNOSIS — H40153 Residual stage of open-angle glaucoma, bilateral: Secondary | ICD-10-CM | POA: Diagnosis not present

## 2016-02-08 DIAGNOSIS — H04569 Stenosis of unspecified lacrimal punctum: Secondary | ICD-10-CM | POA: Diagnosis not present

## 2016-02-08 DIAGNOSIS — H02105 Unspecified ectropion of left lower eyelid: Secondary | ICD-10-CM | POA: Diagnosis not present

## 2016-02-22 DIAGNOSIS — H40153 Residual stage of open-angle glaucoma, bilateral: Secondary | ICD-10-CM | POA: Diagnosis not present

## 2016-03-28 DIAGNOSIS — L309 Dermatitis, unspecified: Secondary | ICD-10-CM | POA: Diagnosis not present

## 2016-04-04 DIAGNOSIS — H01001 Unspecified blepharitis right upper eyelid: Secondary | ICD-10-CM | POA: Diagnosis not present

## 2016-04-19 DIAGNOSIS — L309 Dermatitis, unspecified: Secondary | ICD-10-CM | POA: Diagnosis not present

## 2016-04-29 DIAGNOSIS — Z961 Presence of intraocular lens: Secondary | ICD-10-CM | POA: Diagnosis not present

## 2016-05-02 DIAGNOSIS — L239 Allergic contact dermatitis, unspecified cause: Secondary | ICD-10-CM | POA: Diagnosis not present

## 2016-05-20 DIAGNOSIS — E538 Deficiency of other specified B group vitamins: Secondary | ICD-10-CM | POA: Diagnosis not present

## 2016-05-20 DIAGNOSIS — I10 Essential (primary) hypertension: Secondary | ICD-10-CM | POA: Diagnosis not present

## 2016-05-20 DIAGNOSIS — R51 Headache: Secondary | ICD-10-CM | POA: Diagnosis not present

## 2016-06-19 DIAGNOSIS — I10 Essential (primary) hypertension: Secondary | ICD-10-CM | POA: Insufficient documentation

## 2016-06-19 DIAGNOSIS — E538 Deficiency of other specified B group vitamins: Secondary | ICD-10-CM | POA: Diagnosis not present

## 2016-06-19 DIAGNOSIS — E559 Vitamin D deficiency, unspecified: Secondary | ICD-10-CM | POA: Diagnosis not present

## 2016-08-21 DIAGNOSIS — E539 Vitamin B deficiency, unspecified: Secondary | ICD-10-CM | POA: Diagnosis not present

## 2016-08-21 DIAGNOSIS — E559 Vitamin D deficiency, unspecified: Secondary | ICD-10-CM | POA: Diagnosis not present

## 2016-11-07 DIAGNOSIS — Z0000 Encounter for general adult medical examination without abnormal findings: Secondary | ICD-10-CM | POA: Diagnosis not present

## 2016-11-20 DIAGNOSIS — Z0000 Encounter for general adult medical examination without abnormal findings: Secondary | ICD-10-CM | POA: Diagnosis not present

## 2016-11-20 DIAGNOSIS — Z125 Encounter for screening for malignant neoplasm of prostate: Secondary | ICD-10-CM | POA: Diagnosis not present

## 2016-11-20 DIAGNOSIS — R140 Abdominal distension (gaseous): Secondary | ICD-10-CM | POA: Diagnosis not present

## 2016-11-20 DIAGNOSIS — R0789 Other chest pain: Secondary | ICD-10-CM | POA: Diagnosis not present

## 2016-11-20 DIAGNOSIS — Z23 Encounter for immunization: Secondary | ICD-10-CM | POA: Diagnosis not present

## 2016-11-20 DIAGNOSIS — I10 Essential (primary) hypertension: Secondary | ICD-10-CM | POA: Diagnosis not present

## 2016-11-20 DIAGNOSIS — R0602 Shortness of breath: Secondary | ICD-10-CM | POA: Diagnosis not present

## 2016-11-20 DIAGNOSIS — E785 Hyperlipidemia, unspecified: Secondary | ICD-10-CM | POA: Diagnosis not present

## 2016-11-21 DIAGNOSIS — I10 Essential (primary) hypertension: Secondary | ICD-10-CM | POA: Diagnosis not present

## 2016-11-21 DIAGNOSIS — I200 Unstable angina: Secondary | ICD-10-CM | POA: Diagnosis not present

## 2016-11-21 DIAGNOSIS — R9431 Abnormal electrocardiogram [ECG] [EKG]: Secondary | ICD-10-CM | POA: Diagnosis not present

## 2016-11-21 DIAGNOSIS — E7800 Pure hypercholesterolemia, unspecified: Secondary | ICD-10-CM | POA: Diagnosis not present

## 2016-11-22 DIAGNOSIS — I208 Other forms of angina pectoris: Secondary | ICD-10-CM | POA: Diagnosis present

## 2016-11-27 ENCOUNTER — Ambulatory Visit
Admission: RE | Admit: 2016-11-27 | Discharge: 2016-11-27 | Disposition: A | Discharge status: Home / Self Care | Payer: PPO | Source: Ambulatory Visit | Attending: Internal Medicine | Admitting: Internal Medicine

## 2016-11-27 ENCOUNTER — Encounter
Admission: RE | Disposition: A | Discharge status: Home / Self Care | Payer: Self-pay | Source: Ambulatory Visit | Attending: Internal Medicine

## 2016-11-27 DIAGNOSIS — I25110 Atherosclerotic heart disease of native coronary artery with unstable angina pectoris: Secondary | ICD-10-CM | POA: Diagnosis not present

## 2016-11-27 DIAGNOSIS — Z7982 Long term (current) use of aspirin: Secondary | ICD-10-CM | POA: Diagnosis not present

## 2016-11-27 DIAGNOSIS — I10 Essential (primary) hypertension: Secondary | ICD-10-CM | POA: Insufficient documentation

## 2016-11-27 DIAGNOSIS — Z87891 Personal history of nicotine dependence: Secondary | ICD-10-CM | POA: Insufficient documentation

## 2016-11-27 DIAGNOSIS — I208 Other forms of angina pectoris: Secondary | ICD-10-CM | POA: Diagnosis present

## 2016-11-27 DIAGNOSIS — K219 Gastro-esophageal reflux disease without esophagitis: Secondary | ICD-10-CM | POA: Diagnosis not present

## 2016-11-27 DIAGNOSIS — E7800 Pure hypercholesterolemia, unspecified: Secondary | ICD-10-CM | POA: Diagnosis not present

## 2016-11-27 DIAGNOSIS — I2510 Atherosclerotic heart disease of native coronary artery without angina pectoris: Secondary | ICD-10-CM | POA: Diagnosis not present

## 2016-11-27 HISTORY — PX: LEFT HEART CATH AND CORONARY ANGIOGRAPHY: CATH118249

## 2016-11-27 SURGERY — LEFT HEART CATH AND CORONARY ANGIOGRAPHY
Anesthesia: Moderate Sedation | Laterality: Left

## 2016-11-27 MED ORDER — ONDANSETRON HCL 4 MG/2ML IJ SOLN
4.0000 mg | Freq: Four times a day (QID) | INTRAMUSCULAR | Status: DC | PRN
Start: 2016-11-27 — End: 2016-11-27

## 2016-11-27 MED ORDER — FENTANYL CITRATE (PF) 100 MCG/2ML IJ SOLN
INTRAMUSCULAR | Status: DC | PRN
  Administered 2016-11-27: 25 ug via INTRAVENOUS

## 2016-11-27 MED ORDER — SODIUM CHLORIDE 0.9 % WEIGHT BASED INFUSION: 3.0000 mL/kg/h | INTRAVENOUS | Status: DC

## 2016-11-27 MED ORDER — SODIUM CHLORIDE 0.9 % IV SOLN: 250.0000 mL | INTRAVENOUS | Status: DC | PRN

## 2016-11-27 MED ORDER — ACETAMINOPHEN 325 MG PO TABS: 650.0000 mg | ORAL_TABLET | ORAL | Status: DC | PRN

## 2016-11-27 MED ORDER — SODIUM CHLORIDE 0.9% FLUSH: 3.0000 mL | Freq: Two times a day (BID) | INTRAVENOUS | Status: DC

## 2016-11-27 MED ORDER — ASPIRIN 81 MG PO CHEW
CHEWABLE_TABLET | ORAL | Status: DC | PRN
  Administered 2016-11-27: 81 mg via ORAL

## 2016-11-27 MED ORDER — FENTANYL CITRATE (PF) 100 MCG/2ML IJ SOLN
INTRAMUSCULAR | Status: AC
  Filled 2016-11-27: qty 2

## 2016-11-27 MED ORDER — SODIUM CHLORIDE 0.9% FLUSH: 3.0000 mL | INTRAVENOUS | Status: DC | PRN

## 2016-11-27 MED ORDER — SODIUM CHLORIDE 0.9 % WEIGHT BASED INFUSION: 1.0000 mL/kg/h | INTRAVENOUS | Status: DC

## 2016-11-27 MED ORDER — SODIUM CHLORIDE 0.9 % WEIGHT BASED INFUSION
1.0000 mL/kg/h | INTRAVENOUS | Status: DC
  Administered 2016-11-27: 1 mL/kg/h via INTRAVENOUS

## 2016-11-27 MED ORDER — ASPIRIN 81 MG PO CHEW
CHEWABLE_TABLET | ORAL | Status: AC
  Filled 2016-11-27: qty 1

## 2016-11-27 MED ORDER — ISOSORBIDE MONONITRATE ER 30 MG PO TB24: 30.0000 mg | ORAL_TABLET | Freq: Every day | ORAL | 11 refills | Status: DC

## 2016-11-27 MED ORDER — HEPARIN (PORCINE) IN NACL 2-0.9 UNIT/ML-% IJ SOLN
INTRAMUSCULAR | Status: AC
  Filled 2016-11-27: qty 500

## 2016-11-27 MED ORDER — ASPIRIN 81 MG PO CHEW: 81.0000 mg | CHEWABLE_TABLET | ORAL | Status: DC

## 2016-11-27 MED ORDER — IOPAMIDOL (ISOVUE-300) INJECTION 61%
INTRAVENOUS | Status: DC | PRN
  Administered 2016-11-27: 160 mL via INTRA_ARTERIAL

## 2016-11-27 MED ORDER — MIDAZOLAM HCL 2 MG/2ML IJ SOLN
INTRAMUSCULAR | Status: DC | PRN
  Administered 2016-11-27: 1 mg via INTRAVENOUS

## 2016-11-27 MED ORDER — MIDAZOLAM HCL 2 MG/2ML IJ SOLN
INTRAMUSCULAR | Status: AC
  Filled 2016-11-27: qty 2

## 2016-11-27 SURGICAL SUPPLY — 9 items
CATH 5FR JL4 DIAGNOSTIC (CATHETERS) ×3 IMPLANT
CATH INFINITI 5FR ANG PIGTAIL (CATHETERS) ×3 IMPLANT
CATH INFINITI JR4 5F (CATHETERS) ×3 IMPLANT
DEVICE CLOSURE MYNXGRIP 5F (Vascular Products) ×3 IMPLANT
KIT MANI 3VAL PERCEP (MISCELLANEOUS) ×3 IMPLANT
NEEDLE PERC 18GX7CM (NEEDLE) ×3 IMPLANT
PACK CARDIAC CATH (CUSTOM PROCEDURE TRAY) ×3 IMPLANT
SHEATH PINNACLE 5F 10CM (SHEATH) ×3 IMPLANT
WIRE EMERALD 3MM-J .035X150CM (WIRE) ×3 IMPLANT

## 2016-11-28 ENCOUNTER — Encounter: Payer: Self-pay | Admitting: Internal Medicine

## 2016-12-05 DIAGNOSIS — E7800 Pure hypercholesterolemia, unspecified: Secondary | ICD-10-CM | POA: Diagnosis not present

## 2016-12-05 DIAGNOSIS — I2510 Atherosclerotic heart disease of native coronary artery without angina pectoris: Secondary | ICD-10-CM | POA: Diagnosis not present

## 2016-12-05 DIAGNOSIS — R0602 Shortness of breath: Secondary | ICD-10-CM | POA: Diagnosis not present

## 2016-12-05 DIAGNOSIS — I10 Essential (primary) hypertension: Secondary | ICD-10-CM | POA: Diagnosis not present

## 2016-12-13 DIAGNOSIS — R0602 Shortness of breath: Secondary | ICD-10-CM | POA: Diagnosis not present

## 2017-01-03 DIAGNOSIS — I10 Essential (primary) hypertension: Secondary | ICD-10-CM | POA: Diagnosis not present

## 2017-01-03 DIAGNOSIS — I2510 Atherosclerotic heart disease of native coronary artery without angina pectoris: Secondary | ICD-10-CM | POA: Diagnosis not present

## 2017-01-03 DIAGNOSIS — J410 Simple chronic bronchitis: Secondary | ICD-10-CM | POA: Diagnosis not present

## 2017-01-03 DIAGNOSIS — E7800 Pure hypercholesterolemia, unspecified: Secondary | ICD-10-CM | POA: Diagnosis not present

## 2017-04-23 DIAGNOSIS — Z961 Presence of intraocular lens: Secondary | ICD-10-CM | POA: Diagnosis not present

## 2017-04-29 DIAGNOSIS — Z85828 Personal history of other malignant neoplasm of skin: Secondary | ICD-10-CM | POA: Diagnosis not present

## 2017-04-29 DIAGNOSIS — D2272 Melanocytic nevi of left lower limb, including hip: Secondary | ICD-10-CM | POA: Diagnosis not present

## 2017-04-29 DIAGNOSIS — D2261 Melanocytic nevi of right upper limb, including shoulder: Secondary | ICD-10-CM | POA: Diagnosis not present

## 2017-04-29 DIAGNOSIS — X32XXXA Exposure to sunlight, initial encounter: Secondary | ICD-10-CM | POA: Diagnosis not present

## 2017-04-29 DIAGNOSIS — D225 Melanocytic nevi of trunk: Secondary | ICD-10-CM | POA: Diagnosis not present

## 2017-04-29 DIAGNOSIS — L570 Actinic keratosis: Secondary | ICD-10-CM | POA: Diagnosis not present

## 2017-07-11 DIAGNOSIS — I2510 Atherosclerotic heart disease of native coronary artery without angina pectoris: Secondary | ICD-10-CM | POA: Diagnosis not present

## 2017-07-11 DIAGNOSIS — E7800 Pure hypercholesterolemia, unspecified: Secondary | ICD-10-CM | POA: Diagnosis not present

## 2017-07-11 DIAGNOSIS — R42 Dizziness and giddiness: Secondary | ICD-10-CM | POA: Diagnosis not present

## 2017-07-11 DIAGNOSIS — I10 Essential (primary) hypertension: Secondary | ICD-10-CM | POA: Diagnosis not present

## 2017-07-18 DIAGNOSIS — N183 Chronic kidney disease, stage 3 (moderate): Secondary | ICD-10-CM | POA: Diagnosis not present

## 2017-07-18 DIAGNOSIS — M542 Cervicalgia: Secondary | ICD-10-CM | POA: Diagnosis not present

## 2017-11-24 DIAGNOSIS — N183 Chronic kidney disease, stage 3 (moderate): Secondary | ICD-10-CM | POA: Diagnosis not present

## 2017-11-24 DIAGNOSIS — Z23 Encounter for immunization: Secondary | ICD-10-CM | POA: Diagnosis not present

## 2017-11-24 DIAGNOSIS — Z125 Encounter for screening for malignant neoplasm of prostate: Secondary | ICD-10-CM | POA: Diagnosis not present

## 2017-11-24 DIAGNOSIS — E7800 Pure hypercholesterolemia, unspecified: Secondary | ICD-10-CM | POA: Diagnosis not present

## 2017-11-24 DIAGNOSIS — I10 Essential (primary) hypertension: Secondary | ICD-10-CM | POA: Diagnosis not present

## 2017-11-24 DIAGNOSIS — Z0000 Encounter for general adult medical examination without abnormal findings: Secondary | ICD-10-CM | POA: Diagnosis not present

## 2017-11-24 DIAGNOSIS — M1A9XX0 Chronic gout, unspecified, without tophus (tophi): Secondary | ICD-10-CM | POA: Diagnosis not present

## 2017-11-24 DIAGNOSIS — F17220 Nicotine dependence, chewing tobacco, uncomplicated: Secondary | ICD-10-CM | POA: Diagnosis not present

## 2018-01-12 DIAGNOSIS — K219 Gastro-esophageal reflux disease without esophagitis: Secondary | ICD-10-CM | POA: Diagnosis not present

## 2018-01-12 DIAGNOSIS — I10 Essential (primary) hypertension: Secondary | ICD-10-CM | POA: Diagnosis not present

## 2018-01-12 DIAGNOSIS — R42 Dizziness and giddiness: Secondary | ICD-10-CM | POA: Diagnosis not present

## 2018-01-12 DIAGNOSIS — I2510 Atherosclerotic heart disease of native coronary artery without angina pectoris: Secondary | ICD-10-CM | POA: Diagnosis not present

## 2018-01-12 DIAGNOSIS — E7800 Pure hypercholesterolemia, unspecified: Secondary | ICD-10-CM | POA: Diagnosis not present

## 2018-04-28 DIAGNOSIS — D225 Melanocytic nevi of trunk: Secondary | ICD-10-CM | POA: Diagnosis not present

## 2018-04-28 DIAGNOSIS — Z08 Encounter for follow-up examination after completed treatment for malignant neoplasm: Secondary | ICD-10-CM | POA: Diagnosis not present

## 2018-04-28 DIAGNOSIS — L570 Actinic keratosis: Secondary | ICD-10-CM | POA: Diagnosis not present

## 2018-04-28 DIAGNOSIS — L821 Other seborrheic keratosis: Secondary | ICD-10-CM | POA: Diagnosis not present

## 2018-04-28 DIAGNOSIS — D485 Neoplasm of uncertain behavior of skin: Secondary | ICD-10-CM | POA: Diagnosis not present

## 2018-04-28 DIAGNOSIS — Z85828 Personal history of other malignant neoplasm of skin: Secondary | ICD-10-CM | POA: Diagnosis not present

## 2018-04-28 DIAGNOSIS — X32XXXA Exposure to sunlight, initial encounter: Secondary | ICD-10-CM | POA: Diagnosis not present

## 2018-05-07 DIAGNOSIS — X32XXXA Exposure to sunlight, initial encounter: Secondary | ICD-10-CM | POA: Diagnosis not present

## 2018-05-07 DIAGNOSIS — L570 Actinic keratosis: Secondary | ICD-10-CM | POA: Diagnosis not present

## 2018-07-09 DIAGNOSIS — I2510 Atherosclerotic heart disease of native coronary artery without angina pectoris: Secondary | ICD-10-CM | POA: Diagnosis not present

## 2018-07-09 DIAGNOSIS — I10 Essential (primary) hypertension: Secondary | ICD-10-CM | POA: Diagnosis not present

## 2018-07-09 DIAGNOSIS — E7800 Pure hypercholesterolemia, unspecified: Secondary | ICD-10-CM | POA: Diagnosis not present

## 2018-07-15 DIAGNOSIS — R1310 Dysphagia, unspecified: Secondary | ICD-10-CM | POA: Diagnosis not present

## 2018-07-15 DIAGNOSIS — K219 Gastro-esophageal reflux disease without esophagitis: Secondary | ICD-10-CM | POA: Diagnosis not present

## 2018-07-15 DIAGNOSIS — R1032 Left lower quadrant pain: Secondary | ICD-10-CM | POA: Diagnosis not present

## 2018-07-23 DIAGNOSIS — Z86010 Personal history of colonic polyps: Secondary | ICD-10-CM | POA: Diagnosis not present

## 2018-07-23 DIAGNOSIS — R1032 Left lower quadrant pain: Secondary | ICD-10-CM | POA: Diagnosis not present

## 2018-07-23 HISTORY — DX: Left lower quadrant pain: R10.32

## 2018-07-26 DIAGNOSIS — Z860101 Personal history of adenomatous and serrated colon polyps: Secondary | ICD-10-CM

## 2018-07-26 DIAGNOSIS — Z1211 Encounter for screening for malignant neoplasm of colon: Secondary | ICD-10-CM

## 2018-07-26 DIAGNOSIS — Z86010 Personal history of colonic polyps: Secondary | ICD-10-CM | POA: Insufficient documentation

## 2018-07-26 HISTORY — DX: Encounter for screening for malignant neoplasm of colon: Z12.11

## 2018-07-26 HISTORY — DX: Personal history of adenomatous and serrated colon polyps: Z86.0101

## 2018-08-06 DIAGNOSIS — I2510 Atherosclerotic heart disease of native coronary artery without angina pectoris: Secondary | ICD-10-CM | POA: Diagnosis not present

## 2018-08-06 DIAGNOSIS — E7800 Pure hypercholesterolemia, unspecified: Secondary | ICD-10-CM | POA: Diagnosis not present

## 2018-08-06 DIAGNOSIS — I10 Essential (primary) hypertension: Secondary | ICD-10-CM | POA: Diagnosis not present

## 2018-09-04 ENCOUNTER — Encounter: Payer: Self-pay | Admitting: *Deleted

## 2018-09-07 ENCOUNTER — Encounter: Payer: Self-pay | Admitting: Certified Registered Nurse Anesthetist

## 2018-09-07 ENCOUNTER — Encounter
Admission: RE | Disposition: A | Discharge status: Home / Self Care | Payer: Self-pay | Source: Home / Self Care | Attending: Unknown Physician Specialty

## 2018-09-07 ENCOUNTER — Ambulatory Visit: Payer: PPO | Admitting: Anesthesiology

## 2018-09-07 ENCOUNTER — Ambulatory Visit
Admission: RE | Admit: 2018-09-07 | Discharge: 2018-09-07 | Disposition: A | Discharge status: Home / Self Care | Payer: PPO | Attending: Unknown Physician Specialty | Admitting: Unknown Physician Specialty

## 2018-09-07 DIAGNOSIS — D125 Benign neoplasm of sigmoid colon: Secondary | ICD-10-CM | POA: Insufficient documentation

## 2018-09-07 DIAGNOSIS — K449 Diaphragmatic hernia without obstruction or gangrene: Secondary | ICD-10-CM | POA: Insufficient documentation

## 2018-09-07 DIAGNOSIS — R1314 Dysphagia, pharyngoesophageal phase: Secondary | ICD-10-CM | POA: Diagnosis not present

## 2018-09-07 DIAGNOSIS — Z87442 Personal history of urinary calculi: Secondary | ICD-10-CM | POA: Insufficient documentation

## 2018-09-07 DIAGNOSIS — M109 Gout, unspecified: Secondary | ICD-10-CM | POA: Diagnosis not present

## 2018-09-07 DIAGNOSIS — Z09 Encounter for follow-up examination after completed treatment for conditions other than malignant neoplasm: Secondary | ICD-10-CM | POA: Diagnosis not present

## 2018-09-07 DIAGNOSIS — D123 Benign neoplasm of transverse colon: Secondary | ICD-10-CM | POA: Insufficient documentation

## 2018-09-07 DIAGNOSIS — R1032 Left lower quadrant pain: Secondary | ICD-10-CM | POA: Diagnosis not present

## 2018-09-07 DIAGNOSIS — Z86010 Personal history of colonic polyps: Secondary | ICD-10-CM | POA: Diagnosis not present

## 2018-09-07 DIAGNOSIS — Z79899 Other long term (current) drug therapy: Secondary | ICD-10-CM | POA: Insufficient documentation

## 2018-09-07 DIAGNOSIS — Z87891 Personal history of nicotine dependence: Secondary | ICD-10-CM | POA: Insufficient documentation

## 2018-09-07 DIAGNOSIS — R1310 Dysphagia, unspecified: Secondary | ICD-10-CM | POA: Insufficient documentation

## 2018-09-07 DIAGNOSIS — E785 Hyperlipidemia, unspecified: Secondary | ICD-10-CM | POA: Insufficient documentation

## 2018-09-07 DIAGNOSIS — K210 Gastro-esophageal reflux disease with esophagitis: Secondary | ICD-10-CM | POA: Diagnosis not present

## 2018-09-07 DIAGNOSIS — Z7982 Long term (current) use of aspirin: Secondary | ICD-10-CM | POA: Insufficient documentation

## 2018-09-07 DIAGNOSIS — K317 Polyp of stomach and duodenum: Secondary | ICD-10-CM | POA: Insufficient documentation

## 2018-09-07 DIAGNOSIS — K635 Polyp of colon: Secondary | ICD-10-CM | POA: Diagnosis not present

## 2018-09-07 DIAGNOSIS — K2970 Gastritis, unspecified, without bleeding: Secondary | ICD-10-CM | POA: Diagnosis not present

## 2018-09-07 DIAGNOSIS — Z888 Allergy status to other drugs, medicaments and biological substances status: Secondary | ICD-10-CM | POA: Diagnosis not present

## 2018-09-07 DIAGNOSIS — K640 First degree hemorrhoids: Secondary | ICD-10-CM | POA: Diagnosis not present

## 2018-09-07 DIAGNOSIS — I10 Essential (primary) hypertension: Secondary | ICD-10-CM | POA: Diagnosis not present

## 2018-09-07 HISTORY — DX: Personal history of urinary calculi: Z87.442

## 2018-09-07 HISTORY — PX: COLONOSCOPY WITH PROPOFOL: SHX5780

## 2018-09-07 HISTORY — DX: Left lower quadrant pain: R10.32

## 2018-09-07 HISTORY — PX: SAVORY DILATION: SHX5439

## 2018-09-07 HISTORY — DX: Encounter for screening for malignant neoplasm of colon: Z12.11

## 2018-09-07 HISTORY — DX: Personal history of colonic polyps: Z86.010

## 2018-09-07 HISTORY — PX: ESOPHAGOGASTRODUODENOSCOPY (EGD) WITH PROPOFOL: SHX5813

## 2018-09-07 SURGERY — COLONOSCOPY WITH PROPOFOL
Anesthesia: General

## 2018-09-07 MED ORDER — PROPOFOL 500 MG/50ML IV EMUL
INTRAVENOUS | Status: AC
  Filled 2018-09-07: qty 50

## 2018-09-07 MED ORDER — PROPOFOL 500 MG/50ML IV EMUL
INTRAVENOUS | Status: DC | PRN
  Administered 2018-09-07: 130 ug/kg/min via INTRAVENOUS

## 2018-09-07 MED ORDER — LIDOCAINE HCL (CARDIAC) PF 100 MG/5ML IV SOSY
PREFILLED_SYRINGE | INTRAVENOUS | Status: DC | PRN
  Administered 2018-09-07: 50 mg via INTRAVENOUS

## 2018-09-07 MED ORDER — PROPOFOL 10 MG/ML IV BOLUS
INTRAVENOUS | Status: DC | PRN
  Administered 2018-09-07: 80 mg via INTRAVENOUS

## 2018-09-07 MED ORDER — LIDOCAINE HCL (PF) 1 % IJ SOLN
INTRAMUSCULAR | Status: AC
  Filled 2018-09-07: qty 2

## 2018-09-07 MED ORDER — SODIUM CHLORIDE 0.9 % IV SOLN
INTRAVENOUS | Status: DC
  Administered 2018-09-07: 1000 mL via INTRAVENOUS

## 2018-09-07 MED ORDER — SODIUM CHLORIDE 0.9 % IV SOLN: INTRAVENOUS | Status: DC

## 2018-09-07 MED ORDER — PHENYLEPHRINE HCL 10 MG/ML IJ SOLN
INTRAMUSCULAR | Status: DC | PRN
  Administered 2018-09-07: 200 ug via INTRAVENOUS

## 2018-09-07 MED ORDER — LIDOCAINE HCL (PF) 2 % IJ SOLN
INTRAMUSCULAR | Status: AC
  Filled 2018-09-07: qty 10

## 2018-09-07 NOTE — Transfer of Care (Signed)
Immediate Anesthesia Transfer of Care Note  Patient: Chad Park.  Procedure(s) Performed: COLONOSCOPY WITH PROPOFOL (N/A ) ESOPHAGOGASTRODUODENOSCOPY (EGD) WITH PROPOFOL (N/A ) SAVORY DILATION (N/A )  Patient Location: PACU and Endoscopy Unit  Anesthesia Type:General  Level of Consciousness: drowsy  Airway & Oxygen Therapy: Patient Spontanous Breathing  Post-op Assessment: Report given to RN and Post -op Vital signs reviewed and stable  Post vital signs: Reviewed and stable  Last Vitals:  Vitals Value Taken Time  BP 115/71 09/07/2018  1:37 PM  Temp    Pulse 83 09/07/2018  1:39 PM  Resp 20 09/07/2018  1:40 PM  SpO2 95 % 09/07/2018  1:39 PM  Vitals shown include unvalidated device data.  Last Pain:  Vitals:   09/07/18 1206  TempSrc: Tympanic  PainSc: 0-No pain         Complications: No apparent anesthesia complications

## 2018-09-07 NOTE — Anesthesia Post-op Follow-up Note (Signed)
Anesthesia QCDR form completed.        

## 2018-09-07 NOTE — Op Note (Signed)
[facility] Gastroenterology Patient Name: Chad Park Procedure Date: 09/07/2018 12:34 PM MRN: [id number] Account #: [id number] Date of Birth: 1941-04-29 Admit Type: Outpatient Age: 78 Room: [facility] ENDO ROOM 1 Gender: Male Note Status: Finalized Procedure:            Colonoscopy Indications:          High risk colon cancer surveillance: Personal history                        of colonic polyps Providers:            Manya Silvas, MD Medicines:            Propofol per Anesthesia Complications:        No immediate complications. Procedure:            Pre-Anesthesia Assessment:                       - After reviewing the risks and benefits, the patient                        was deemed in satisfactory condition to undergo the                        procedure.                       - After reviewing the risks and benefits, the patient                        was deemed in satisfactory condition to undergo the                        procedure.                       After obtaining informed consent, the colonoscope was                        passed under direct vision. Throughout the procedure,                        the patient's blood pressure, pulse, and oxygen                        saturations were monitored continuously. The                        Colonoscope was introduced through the anus and                        advanced to the the cecum, identified by appendiceal                        orifice and ileocecal valve. The colonoscopy was                        performed without difficulty. The patient tolerated the                        procedure well. The quality of the bowel preparation  was good. Findings:      Two sessile polyps were found in the transverse colon. The polyps were       diminutive in size. These polyps were removed with a jumbo cold forceps.       Resection and retrieval were complete.      A diminutive polyp was  found in the sigmoid colon. The polyp was       sessile. The polyp was removed with a jumbo cold forceps. Resection and       retrieval were complete.      Internal hemorrhoids were found during endoscopy. The hemorrhoids were       small and Grade I (internal hemorrhoids that do not prolapse).      The exam was otherwise without abnormality. Impression:           - Two diminutive polyps in the transverse colon,                        removed with a jumbo cold forceps. Resected and                        retrieved.                       - One diminutive polyp in the sigmoid colon, removed                        with a jumbo cold forceps. Resected and retrieved.                       - Internal hemorrhoids.                       - The examination was otherwise normal. Recommendation:       - Await pathology results. Manya Silvas, MD 09/07/2018 1:36:04 PM This report has been signed electronically. Number of Addenda: 0 Note Initiated On: 09/07/2018 12:34 PM Scope Withdrawal Time: 0 hours 6 minutes 53 seconds  Total Procedure Duration: 0 hours 15 minutes 19 seconds       [facility]

## 2018-09-07 NOTE — Anesthesia Preprocedure Evaluation (Addendum)
Anesthesia Evaluation  Patient identified by MRN, date of birth, ID band Patient awake    Reviewed: Allergy & Precautions, H&P , NPO status , Patient's Chart, lab work & pertinent test results  Airway Mallampati: III  TM Distance: >3 FB     Dental  (+) Upper Dentures, Lower Dentures   Pulmonary neg pulmonary ROS, neg COPD, neg recent URI, former smoker,           Cardiovascular hypertension, + angina + CAD  (-) Past MI, (-) Cardiac Stents and (-) CABG (-) dysrhythmias      Neuro/Psych negative neurological ROS  negative psych ROS   GI/Hepatic Neg liver ROS, hiatal hernia, GERD  ,  Endo/Other  negative endocrine ROS  Renal/GU negative Renal ROS  negative genitourinary   Musculoskeletal   Abdominal   Peds  Hematology negative hematology ROS (+)   Anesthesia Other Findings Past Medical History: 07/23/2018: Abdominal pain, acute, left lower quadrant Left Eye: Cataract No date: Colon polyps No date: Dysphagia 07/26/2018: Encounter for colonoscopy due to history of adenomatous  colonic polyps No date: GERD (gastroesophageal reflux disease) No date: Gout No date: Hiatal hernia No date: History of kidney stones No date: Hyperlipidemia No date: Kidney stones No date: Peyronie's disease  Past Surgical History: 03/22/2015: COLONOSCOPY WITH PROPOFOL; N/A     Comment:  Procedure: COLONOSCOPY WITH PROPOFOL;  Surgeon: Manya Silvas, MD;  Location: [facility] ENDOSCOPY;  Service:               Endoscopy;  Laterality: N/A; No date: COLONOSCOPY, ESOPHAGOGASTRODUODENOSCOPY (EGD) AND ESOPHAGEAL  DILATION 03/22/2015: ESOPHAGOGASTRODUODENOSCOPY (EGD) WITH PROPOFOL; N/A     Comment:  Procedure: ESOPHAGOGASTRODUODENOSCOPY (EGD) WITH               PROPOFOL;  Surgeon: Manya Silvas, MD;  Location: [facility]              ENDOSCOPY;  Service: Endoscopy;  Laterality: N/A; 11/27/2016: LEFT HEART CATH AND CORONARY ANGIOGRAPHY;  Left     Comment:  Procedure: Left Heart Cath and Coronary Angiography;                Surgeon: Corey Skains, MD;  Location: [place]               CV LAB;  Service: Cardiovascular;  Laterality: Left;     Reproductive/Obstetrics negative OB ROS                            Anesthesia Physical Anesthesia Plan  ASA: III  Anesthesia Plan: General   Post-op Pain Management:    Induction:   PONV Risk Score and Plan: Propofol infusion and TIVA  Airway Management Planned: Nasal Cannula  Additional Equipment:   Intra-op Plan:   Post-operative Plan:   Informed Consent: I have reviewed the patients History and Physical, chart, labs and discussed the procedure including the risks, benefits and alternatives for the proposed anesthesia with the patient or authorized representative who has indicated his/her understanding and acceptance.     Dental Advisory Given  Plan Discussed with: Anesthesiologist and CRNA  Anesthesia Plan Comments:        Anesthesia Quick Evaluation

## 2018-09-07 NOTE — Op Note (Signed)
[facility] Gastroenterology Patient Name: Chad Park Procedure Date: 09/07/2018 12:34 PM MRN: [id number] Account #: [id number] Date of Birth: February 24, 1941 Admit Type: Outpatient Age: 78 Room: [facility] ENDO ROOM 1 Gender: Male Note Status: Finalized Procedure:            Upper GI endoscopy Indications:          Dysphagia Providers:            Manya Silvas, MD Medicines:            Propofol per Anesthesia Complications:        No immediate complications. Procedure:            Pre-Anesthesia Assessment:                       - After reviewing the risks and benefits, the patient                        was deemed in satisfactory condition to undergo the                        procedure.                       After obtaining informed consent, the endoscope was                        passed under direct vision. Throughout the procedure,                        the patient's blood pressure, pulse, and oxygen                        saturations were monitored continuously. The Endoscope                        was introduced through the mouth, and advanced to the                        second part of duodenum. The upper GI endoscopy was                        accomplished without difficulty. The patient tolerated                        the procedure well. Findings:      LA Grade A (one or more mucosal breaks less than 5 mm, not extending       between tops of 2 mucosal folds) esophagitis with no bleeding was found       39 cm from the incisors.      Patchy mildly erythematous mucosa without bleeding was found in the       gastric body and in the gastric antrum. Biopsies were taken with a cold       forceps for histology.      A very small nodule removed with forceps from the duodenum.      A single 12 mm sessile polyp with no bleeding was found in the second       portion of the duodenum. It looks like a sessile duodenal polyp in 2ed       portion of duodenum. Will send  to [facility] for removal. Impression:           - LA Grade A reflux esophagitis. Rule out Barrett's                        esophagus.                       - Erythematous mucosa in the gastric body and antrum.                        Biopsied.                       - A single duodenal polyp. Recommendation:       - Await pathology results. Refer to [place]. Manya Silvas, MD 09/07/2018 1:15:34 PM This report has been signed electronically. Number of Addenda: 0 Note Initiated On: 09/07/2018 12:34 PM      [facility]

## 2018-09-07 NOTE — H&P (Signed)
Primary Care Physician:  Maryland Pink, MD Primary Gastroenterologist:  Dr. Vira Agar  Pre-Procedure History & Physical: HPI:  Chad Eddins. is a 78 y.o. male is here for an endoscopy and colonoscopy.   Past Medical History:  Diagnosis Date  . Abdominal pain, acute, left lower quadrant 07/23/2018  . Cataract Left Eye  . Colon polyps   . Dysphagia   . Encounter for colonoscopy due to history of adenomatous colonic polyps 07/26/2018  . GERD (gastroesophageal reflux disease)   . Gout   . Hiatal hernia   . History of kidney stones   . Hyperlipidemia   . Kidney stones   . Peyronie's disease     Past Surgical History:  Procedure Laterality Date  . COLONOSCOPY WITH PROPOFOL N/A 03/22/2015   Procedure: COLONOSCOPY WITH PROPOFOL;  Surgeon: Manya Silvas, MD;  Location: [facility] ENDOSCOPY;  Service: Endoscopy;  Laterality: N/A;  . COLONOSCOPY, ESOPHAGOGASTRODUODENOSCOPY (EGD) AND ESOPHAGEAL DILATION    . ESOPHAGOGASTRODUODENOSCOPY (EGD) WITH PROPOFOL N/A 03/22/2015   Procedure: ESOPHAGOGASTRODUODENOSCOPY (EGD) WITH PROPOFOL;  Surgeon: Manya Silvas, MD;  Location: [facility] ENDOSCOPY;  Service: Endoscopy;  Laterality: N/A;  . LEFT HEART CATH AND CORONARY ANGIOGRAPHY Left 11/27/2016   Procedure: Left Heart Cath and Coronary Angiography;  Surgeon: Corey Skains, MD;  Location: [place] CV LAB;  Service: Cardiovascular;  Laterality: Left;    Prior to Admission medications   Medication Sig Start Date End Date Taking? Authorizing Provider  allopurinol (ZYLOPRIM) 300 MG tablet Take 300 mg by mouth daily.   Yes [provider]  amLODipine (NORVASC) 5 MG tablet Take 5 mg by mouth daily.   Yes [provider]  telmisartan (MICARDIS) 80 MG tablet Take 80 mg by mouth daily.   Yes [provider]  aspirin 81 MG tablet Take 81 mg by mouth daily.    [provider]  Cholecalciferol (VITAMIN D) 2000 units CAPS Take 2,000 Units by mouth daily.    [provider]  Cyanocobalamin (B-12) 3000 MCG CAPS Take 3,000 mcg by mouth daily.    [provider]  isosorbide mononitrate (IMDUR) 30 MG 24 hr tablet Take 1 tablet (30 mg total) by mouth daily. 11/27/16 11/27/17  Corey Skains, MD  losartan (COZAAR) 100 MG tablet Take 100 mg by mouth daily. 11/20/16 11/20/17  [provider]  omeprazole (PRILOSEC) 20 MG capsule Take 20 mg by mouth daily.    [provider]  pravastatin (PRAVACHOL) 40 MG tablet Take 40 mg by mouth daily.    [provider]  triamcinolone ointment (KENALOG) 0.5 % Apply 1 application topically daily as needed (rash and itching).  12/27/15   [provider]    Allergies as of 07/23/2018 - Review Complete 11/27/2016  Allergen Reaction Noted  . Zocor [simvastatin] Other (See Comments) 03/21/2015    Family History  Problem Relation Age of Onset  . Pulmonary embolism Mother   . Lung cancer Father   . Lung cancer Brother     Social History   Socioeconomic History  . Marital status: Married    Spouse name: Not on file  . Number of children: Not on file  . Years of education: Not on file  . Highest education level: Not on file  Occupational History  . Not on file  Social Needs  . Financial resource strain: Not on file  . Food insecurity:    Worry: Not on file    Inability: Not on file  .  Transportation needs:    Medical: Not on file    Non-medical: Not on file  Tobacco Use  . Smoking status: Former Smoker    Years: 20.00  . Smokeless tobacco: Current User    Types: Chew  . Tobacco comment: quit 30 years ago   Substance and Sexual Activity  . Alcohol use: Yes    Alcohol/week: 2.0 - 3.0 standard drinks    Types: 2 - 3 Cans of beer per week  . Drug use: No  . Sexual activity: Not on file  Lifestyle  . Physical activity:    Days per week: Not on file    Minutes per session: Not on file  . Stress: Not on file  Relationships  . Social connections:    Talks on phone:  Not on file    Gets together: Not on file    Attends religious service: Not on file    Active member of club or organization: Not on file    Attends meetings of clubs or organizations: Not on file    Relationship status: Not on file  . Intimate partner violence:    Fear of current or ex partner: Not on file    Emotionally abused: Not on file    Physically abused: Not on file    Forced sexual activity: Not on file  Other Topics Concern  . Not on file  Social History Narrative  . Not on file    Review of Systems: See HPI, otherwise negative ROS  Physical Exam: BP (!) 153/96   Pulse 77   Temp 98.6 F (37 C) (Tympanic)   Resp 17   Ht 5\' 5"  (1.651 m)   Wt 68.5 kg   SpO2 99%   BMI 25.13 kg/m  General:   Alert,  pleasant and cooperative in NAD Head:  Normocephalic and atraumatic. Neck:  Supple; no masses or thyromegaly. Lungs:  Clear throughout to auscultation.    Heart:  Regular rate and rhythm. Abdomen:  Soft, nontender and nondistended. Normal bowel sounds, without guarding, and without rebound.   Neurologic:  Alert and  oriented x4;  grossly normal neurologically.  Impression/Plan: Chad Elisha Ponder. is here for an endoscopy and colonoscopy to be performed for GERD, dysphagia, LLQ abd pain. PH colon polyps.  Risks, benefits, limitations, and alternatives regarding  endoscopy and colonoscopy have been reviewed with the patient.  Questions have been answered.  All parties agreeable.   Gaylyn Cheers, MD  09/07/2018, 12:48 PM

## 2018-09-08 LAB — SURGICAL PATHOLOGY

## 2018-09-09 NOTE — Anesthesia Postprocedure Evaluation (Signed)
Anesthesia Post Note  Patient: Chad Park.  Procedure(s) Performed: COLONOSCOPY WITH PROPOFOL (N/A ) ESOPHAGOGASTRODUODENOSCOPY (EGD) WITH PROPOFOL (N/A ) SAVORY DILATION (N/A )  Patient location during evaluation: PACU Anesthesia Type: General Level of consciousness: awake and alert Pain management: pain level controlled Vital Signs Assessment: post-procedure vital signs reviewed and stable Respiratory status: spontaneous breathing, nonlabored ventilation and respiratory function stable Cardiovascular status: blood pressure returned to baseline and stable Postop Assessment: no apparent nausea or vomiting Anesthetic complications: no     Last Vitals:  Vitals:   09/07/18 1339 09/07/18 1349  BP:    Pulse:    Resp:  20  Temp: (!) 36.2 C   SpO2:      Last Pain:  Vitals:   09/08/18 0729  TempSrc:   PainSc: 0-No pain                 Durenda Hurt

## 2018-11-12 DIAGNOSIS — Z125 Encounter for screening for malignant neoplasm of prostate: Secondary | ICD-10-CM | POA: Diagnosis not present

## 2018-11-12 DIAGNOSIS — E7800 Pure hypercholesterolemia, unspecified: Secondary | ICD-10-CM | POA: Diagnosis not present

## 2018-11-12 DIAGNOSIS — I10 Essential (primary) hypertension: Secondary | ICD-10-CM | POA: Diagnosis not present

## 2018-11-19 DIAGNOSIS — D132 Benign neoplasm of duodenum: Secondary | ICD-10-CM | POA: Diagnosis not present

## 2018-11-25 DIAGNOSIS — Z03818 Encounter for observation for suspected exposure to other biological agents ruled out: Secondary | ICD-10-CM | POA: Diagnosis not present

## 2018-11-27 DIAGNOSIS — K317 Polyp of stomach and duodenum: Secondary | ICD-10-CM | POA: Diagnosis not present

## 2018-11-27 DIAGNOSIS — Z85828 Personal history of other malignant neoplasm of skin: Secondary | ICD-10-CM | POA: Diagnosis not present

## 2018-11-27 DIAGNOSIS — I10 Essential (primary) hypertension: Secondary | ICD-10-CM | POA: Diagnosis not present

## 2018-11-27 DIAGNOSIS — Z7982 Long term (current) use of aspirin: Secondary | ICD-10-CM | POA: Diagnosis not present

## 2018-11-27 DIAGNOSIS — K3189 Other diseases of stomach and duodenum: Secondary | ICD-10-CM | POA: Diagnosis not present

## 2018-11-27 DIAGNOSIS — Z87442 Personal history of urinary calculi: Secondary | ICD-10-CM | POA: Diagnosis not present

## 2018-11-27 DIAGNOSIS — D132 Benign neoplasm of duodenum: Secondary | ICD-10-CM | POA: Diagnosis not present

## 2018-11-27 DIAGNOSIS — K219 Gastro-esophageal reflux disease without esophagitis: Secondary | ICD-10-CM | POA: Diagnosis not present

## 2018-11-27 DIAGNOSIS — E785 Hyperlipidemia, unspecified: Secondary | ICD-10-CM | POA: Diagnosis not present

## 2018-11-27 DIAGNOSIS — I2510 Atherosclerotic heart disease of native coronary artery without angina pectoris: Secondary | ICD-10-CM | POA: Diagnosis not present

## 2018-11-27 DIAGNOSIS — M109 Gout, unspecified: Secondary | ICD-10-CM | POA: Diagnosis not present

## 2018-11-27 DIAGNOSIS — E559 Vitamin D deficiency, unspecified: Secondary | ICD-10-CM | POA: Diagnosis not present

## 2018-11-27 DIAGNOSIS — D649 Anemia, unspecified: Secondary | ICD-10-CM | POA: Diagnosis not present

## 2018-11-27 DIAGNOSIS — Z87891 Personal history of nicotine dependence: Secondary | ICD-10-CM | POA: Diagnosis not present

## 2018-11-27 DIAGNOSIS — Z79899 Other long term (current) drug therapy: Secondary | ICD-10-CM | POA: Diagnosis not present

## 2018-12-01 DIAGNOSIS — E7800 Pure hypercholesterolemia, unspecified: Secondary | ICD-10-CM | POA: Diagnosis not present

## 2018-12-01 DIAGNOSIS — Z23 Encounter for immunization: Secondary | ICD-10-CM | POA: Diagnosis not present

## 2018-12-01 DIAGNOSIS — I10 Essential (primary) hypertension: Secondary | ICD-10-CM | POA: Diagnosis not present

## 2018-12-01 DIAGNOSIS — M109 Gout, unspecified: Secondary | ICD-10-CM | POA: Diagnosis not present

## 2018-12-01 DIAGNOSIS — Z0000 Encounter for general adult medical examination without abnormal findings: Secondary | ICD-10-CM | POA: Diagnosis not present

## 2018-12-01 DIAGNOSIS — F17200 Nicotine dependence, unspecified, uncomplicated: Secondary | ICD-10-CM | POA: Diagnosis not present

## 2018-12-01 DIAGNOSIS — K219 Gastro-esophageal reflux disease without esophagitis: Secondary | ICD-10-CM | POA: Diagnosis not present

## 2018-12-01 DIAGNOSIS — Z125 Encounter for screening for malignant neoplasm of prostate: Secondary | ICD-10-CM | POA: Diagnosis not present

## 2018-12-01 DIAGNOSIS — Z1331 Encounter for screening for depression: Secondary | ICD-10-CM | POA: Diagnosis not present

## 2018-12-07 DIAGNOSIS — D649 Anemia, unspecified: Secondary | ICD-10-CM | POA: Diagnosis not present

## 2019-02-15 DIAGNOSIS — I10 Essential (primary) hypertension: Secondary | ICD-10-CM | POA: Diagnosis not present

## 2019-02-15 DIAGNOSIS — E7800 Pure hypercholesterolemia, unspecified: Secondary | ICD-10-CM | POA: Diagnosis not present

## 2019-02-15 DIAGNOSIS — I2510 Atherosclerotic heart disease of native coronary artery without angina pectoris: Secondary | ICD-10-CM | POA: Diagnosis not present

## 2019-02-17 ENCOUNTER — Other Ambulatory Visit (HOSPITAL_COMMUNITY): Payer: Self-pay | Admitting: Family Medicine

## 2019-02-17 ENCOUNTER — Other Ambulatory Visit: Payer: Self-pay | Admitting: Family Medicine

## 2019-02-17 DIAGNOSIS — R109 Unspecified abdominal pain: Secondary | ICD-10-CM

## 2019-02-17 DIAGNOSIS — R1084 Generalized abdominal pain: Secondary | ICD-10-CM | POA: Diagnosis not present

## 2019-02-17 DIAGNOSIS — R140 Abdominal distension (gaseous): Secondary | ICD-10-CM

## 2019-02-17 DIAGNOSIS — M109 Gout, unspecified: Secondary | ICD-10-CM | POA: Diagnosis not present

## 2019-02-25 ENCOUNTER — Ambulatory Visit
Admission: RE | Admit: 2019-02-25 | Discharge: 2019-02-25 | Disposition: A | Discharge status: Home / Self Care | Payer: PPO | Source: Ambulatory Visit | Attending: Family Medicine | Admitting: Family Medicine

## 2019-02-25 ENCOUNTER — Other Ambulatory Visit: Payer: Self-pay

## 2019-02-25 DIAGNOSIS — R140 Abdominal distension (gaseous): Secondary | ICD-10-CM | POA: Diagnosis not present

## 2019-02-25 DIAGNOSIS — R109 Unspecified abdominal pain: Secondary | ICD-10-CM | POA: Diagnosis not present

## 2019-02-25 DIAGNOSIS — K4090 Unilateral inguinal hernia, without obstruction or gangrene, not specified as recurrent: Secondary | ICD-10-CM | POA: Diagnosis not present

## 2019-02-25 DIAGNOSIS — K5730 Diverticulosis of large intestine without perforation or abscess without bleeding: Secondary | ICD-10-CM | POA: Diagnosis not present

## 2019-02-25 MED ORDER — IOHEXOL 350 MG/ML SOLN: 100.0000 mL | Freq: Once | INTRAVENOUS | Status: DC | PRN

## 2019-02-25 MED ORDER — IOHEXOL 300 MG/ML  SOLN
100.0000 mL | Freq: Once | INTRAMUSCULAR | Status: AC | PRN
  Administered 2019-02-25: 100 mL via INTRAVENOUS

## 2019-05-07 DIAGNOSIS — D2261 Melanocytic nevi of right upper limb, including shoulder: Secondary | ICD-10-CM | POA: Diagnosis not present

## 2019-05-07 DIAGNOSIS — X32XXXA Exposure to sunlight, initial encounter: Secondary | ICD-10-CM | POA: Diagnosis not present

## 2019-05-07 DIAGNOSIS — D0439 Carcinoma in situ of skin of other parts of face: Secondary | ICD-10-CM | POA: Diagnosis not present

## 2019-05-07 DIAGNOSIS — D2271 Melanocytic nevi of right lower limb, including hip: Secondary | ICD-10-CM | POA: Diagnosis not present

## 2019-05-07 DIAGNOSIS — L570 Actinic keratosis: Secondary | ICD-10-CM | POA: Diagnosis not present

## 2019-05-07 DIAGNOSIS — Z85828 Personal history of other malignant neoplasm of skin: Secondary | ICD-10-CM | POA: Diagnosis not present

## 2019-05-07 DIAGNOSIS — D485 Neoplasm of uncertain behavior of skin: Secondary | ICD-10-CM | POA: Diagnosis not present

## 2019-05-07 DIAGNOSIS — D2262 Melanocytic nevi of left upper limb, including shoulder: Secondary | ICD-10-CM | POA: Diagnosis not present

## 2019-05-24 DIAGNOSIS — Z01812 Encounter for preprocedural laboratory examination: Secondary | ICD-10-CM | POA: Diagnosis not present

## 2019-05-24 DIAGNOSIS — Z20828 Contact with and (suspected) exposure to other viral communicable diseases: Secondary | ICD-10-CM | POA: Diagnosis not present

## 2019-05-27 DIAGNOSIS — Z85828 Personal history of other malignant neoplasm of skin: Secondary | ICD-10-CM | POA: Diagnosis not present

## 2019-05-27 DIAGNOSIS — I10 Essential (primary) hypertension: Secondary | ICD-10-CM | POA: Diagnosis not present

## 2019-05-27 DIAGNOSIS — E559 Vitamin D deficiency, unspecified: Secondary | ICD-10-CM | POA: Diagnosis not present

## 2019-05-27 DIAGNOSIS — Z86010 Personal history of colonic polyps: Secondary | ICD-10-CM | POA: Diagnosis not present

## 2019-05-27 DIAGNOSIS — Z888 Allergy status to other drugs, medicaments and biological substances status: Secondary | ICD-10-CM | POA: Diagnosis not present

## 2019-05-27 DIAGNOSIS — Z8719 Personal history of other diseases of the digestive system: Secondary | ICD-10-CM | POA: Diagnosis not present

## 2019-05-27 DIAGNOSIS — Z09 Encounter for follow-up examination after completed treatment for conditions other than malignant neoplasm: Secondary | ICD-10-CM | POA: Diagnosis not present

## 2019-05-27 DIAGNOSIS — K2980 Duodenitis without bleeding: Secondary | ICD-10-CM | POA: Diagnosis not present

## 2019-05-27 DIAGNOSIS — T183XXS Foreign body in small intestine, sequela: Secondary | ICD-10-CM | POA: Diagnosis not present

## 2019-05-27 DIAGNOSIS — K3189 Other diseases of stomach and duodenum: Secondary | ICD-10-CM | POA: Diagnosis not present

## 2019-05-27 DIAGNOSIS — Z7982 Long term (current) use of aspirin: Secondary | ICD-10-CM | POA: Diagnosis not present

## 2019-05-27 DIAGNOSIS — K317 Polyp of stomach and duodenum: Secondary | ICD-10-CM | POA: Diagnosis not present

## 2019-05-27 DIAGNOSIS — K219 Gastro-esophageal reflux disease without esophagitis: Secondary | ICD-10-CM | POA: Diagnosis not present

## 2019-05-27 DIAGNOSIS — Z87891 Personal history of nicotine dependence: Secondary | ICD-10-CM | POA: Diagnosis not present

## 2019-05-27 DIAGNOSIS — Z79899 Other long term (current) drug therapy: Secondary | ICD-10-CM | POA: Diagnosis not present

## 2019-05-27 DIAGNOSIS — E785 Hyperlipidemia, unspecified: Secondary | ICD-10-CM | POA: Diagnosis not present

## 2019-05-27 DIAGNOSIS — M109 Gout, unspecified: Secondary | ICD-10-CM | POA: Diagnosis not present

## 2019-05-27 DIAGNOSIS — E538 Deficiency of other specified B group vitamins: Secondary | ICD-10-CM | POA: Diagnosis not present

## 2019-05-27 DIAGNOSIS — I2510 Atherosclerotic heart disease of native coronary artery without angina pectoris: Secondary | ICD-10-CM | POA: Diagnosis not present

## 2019-06-07 DIAGNOSIS — D0439 Carcinoma in situ of skin of other parts of face: Secondary | ICD-10-CM | POA: Diagnosis not present

## 2019-08-04 DIAGNOSIS — E7800 Pure hypercholesterolemia, unspecified: Secondary | ICD-10-CM | POA: Diagnosis not present

## 2019-08-04 DIAGNOSIS — I2510 Atherosclerotic heart disease of native coronary artery without angina pectoris: Secondary | ICD-10-CM | POA: Diagnosis not present

## 2019-08-04 DIAGNOSIS — I10 Essential (primary) hypertension: Secondary | ICD-10-CM | POA: Diagnosis not present

## 2019-10-13 DIAGNOSIS — D225 Melanocytic nevi of trunk: Secondary | ICD-10-CM | POA: Diagnosis not present

## 2019-10-13 DIAGNOSIS — Z85828 Personal history of other malignant neoplasm of skin: Secondary | ICD-10-CM | POA: Diagnosis not present

## 2019-10-13 DIAGNOSIS — D2262 Melanocytic nevi of left upper limb, including shoulder: Secondary | ICD-10-CM | POA: Diagnosis not present

## 2019-10-13 DIAGNOSIS — D2261 Melanocytic nevi of right upper limb, including shoulder: Secondary | ICD-10-CM | POA: Diagnosis not present

## 2019-10-13 DIAGNOSIS — L821 Other seborrheic keratosis: Secondary | ICD-10-CM | POA: Diagnosis not present

## 2019-10-13 DIAGNOSIS — X32XXXA Exposure to sunlight, initial encounter: Secondary | ICD-10-CM | POA: Diagnosis not present

## 2019-10-13 DIAGNOSIS — L570 Actinic keratosis: Secondary | ICD-10-CM | POA: Diagnosis not present

## 2019-11-01 DIAGNOSIS — H0012 Chalazion right lower eyelid: Secondary | ICD-10-CM | POA: Diagnosis not present

## 2019-11-26 DIAGNOSIS — I10 Essential (primary) hypertension: Secondary | ICD-10-CM | POA: Diagnosis not present

## 2019-11-26 DIAGNOSIS — E7800 Pure hypercholesterolemia, unspecified: Secondary | ICD-10-CM | POA: Diagnosis not present

## 2019-11-26 DIAGNOSIS — Z125 Encounter for screening for malignant neoplasm of prostate: Secondary | ICD-10-CM | POA: Diagnosis not present

## 2019-12-02 DIAGNOSIS — Z125 Encounter for screening for malignant neoplasm of prostate: Secondary | ICD-10-CM | POA: Diagnosis not present

## 2019-12-02 DIAGNOSIS — E7800 Pure hypercholesterolemia, unspecified: Secondary | ICD-10-CM | POA: Diagnosis not present

## 2019-12-02 DIAGNOSIS — I2510 Atherosclerotic heart disease of native coronary artery without angina pectoris: Secondary | ICD-10-CM | POA: Diagnosis not present

## 2019-12-02 DIAGNOSIS — Z0000 Encounter for general adult medical examination without abnormal findings: Secondary | ICD-10-CM | POA: Diagnosis not present

## 2019-12-02 DIAGNOSIS — D649 Anemia, unspecified: Secondary | ICD-10-CM | POA: Diagnosis not present

## 2019-12-02 DIAGNOSIS — I10 Essential (primary) hypertension: Secondary | ICD-10-CM | POA: Diagnosis not present

## 2020-02-03 DIAGNOSIS — E7800 Pure hypercholesterolemia, unspecified: Secondary | ICD-10-CM | POA: Diagnosis not present

## 2020-02-03 DIAGNOSIS — I2510 Atherosclerotic heart disease of native coronary artery without angina pectoris: Secondary | ICD-10-CM | POA: Diagnosis not present

## 2020-02-03 DIAGNOSIS — R0602 Shortness of breath: Secondary | ICD-10-CM | POA: Diagnosis not present

## 2020-02-03 DIAGNOSIS — R011 Cardiac murmur, unspecified: Secondary | ICD-10-CM | POA: Diagnosis not present

## 2020-02-03 DIAGNOSIS — R0989 Other specified symptoms and signs involving the circulatory and respiratory systems: Secondary | ICD-10-CM | POA: Diagnosis not present

## 2020-02-03 DIAGNOSIS — R0600 Dyspnea, unspecified: Secondary | ICD-10-CM | POA: Diagnosis not present

## 2020-02-03 DIAGNOSIS — I10 Essential (primary) hypertension: Secondary | ICD-10-CM | POA: Diagnosis not present

## 2020-06-14 DIAGNOSIS — Z85828 Personal history of other malignant neoplasm of skin: Secondary | ICD-10-CM | POA: Diagnosis not present

## 2020-06-14 DIAGNOSIS — C44629 Squamous cell carcinoma of skin of left upper limb, including shoulder: Secondary | ICD-10-CM | POA: Diagnosis not present

## 2020-06-14 DIAGNOSIS — L570 Actinic keratosis: Secondary | ICD-10-CM | POA: Diagnosis not present

## 2020-06-14 DIAGNOSIS — D2271 Melanocytic nevi of right lower limb, including hip: Secondary | ICD-10-CM | POA: Diagnosis not present

## 2020-06-14 DIAGNOSIS — D225 Melanocytic nevi of trunk: Secondary | ICD-10-CM | POA: Diagnosis not present

## 2020-06-14 DIAGNOSIS — D2261 Melanocytic nevi of right upper limb, including shoulder: Secondary | ICD-10-CM | POA: Diagnosis not present

## 2020-06-14 DIAGNOSIS — X32XXXA Exposure to sunlight, initial encounter: Secondary | ICD-10-CM | POA: Diagnosis not present

## 2020-06-14 DIAGNOSIS — L300 Nummular dermatitis: Secondary | ICD-10-CM | POA: Diagnosis not present

## 2020-06-14 DIAGNOSIS — D485 Neoplasm of uncertain behavior of skin: Secondary | ICD-10-CM | POA: Diagnosis not present

## 2020-07-20 DIAGNOSIS — C44629 Squamous cell carcinoma of skin of left upper limb, including shoulder: Secondary | ICD-10-CM | POA: Diagnosis not present

## 2020-07-20 DIAGNOSIS — D2362 Other benign neoplasm of skin of left upper limb, including shoulder: Secondary | ICD-10-CM | POA: Diagnosis not present

## 2020-08-28 DIAGNOSIS — R1314 Dysphagia, pharyngoesophageal phase: Secondary | ICD-10-CM | POA: Diagnosis not present

## 2020-08-28 DIAGNOSIS — D132 Benign neoplasm of duodenum: Secondary | ICD-10-CM | POA: Diagnosis not present

## 2020-10-16 ENCOUNTER — Other Ambulatory Visit: Admission: RE | Admit: 2020-10-16 | Payer: PPO | Source: Ambulatory Visit

## 2020-10-17 ENCOUNTER — Encounter: Payer: Self-pay | Admitting: Internal Medicine

## 2020-10-18 ENCOUNTER — Ambulatory Visit: Payer: PPO | Admitting: Registered Nurse

## 2020-10-18 ENCOUNTER — Ambulatory Visit: Payer: PPO

## 2020-10-18 ENCOUNTER — Ambulatory Visit
Admission: RE | Admit: 2020-10-18 | Discharge: 2020-10-18 | Disposition: A | Discharge status: Home / Self Care | Payer: PPO | Attending: Internal Medicine | Admitting: Internal Medicine

## 2020-10-18 ENCOUNTER — Encounter
Admission: RE | Disposition: A | Discharge status: Home / Self Care | Payer: Self-pay | Source: Home / Self Care | Attending: Internal Medicine

## 2020-10-18 ENCOUNTER — Encounter: Payer: Self-pay | Admitting: Internal Medicine

## 2020-10-18 ENCOUNTER — Other Ambulatory Visit: Payer: Self-pay

## 2020-10-18 DIAGNOSIS — I10 Essential (primary) hypertension: Secondary | ICD-10-CM | POA: Insufficient documentation

## 2020-10-18 DIAGNOSIS — Z888 Allergy status to other drugs, medicaments and biological substances status: Secondary | ICD-10-CM | POA: Diagnosis not present

## 2020-10-18 DIAGNOSIS — Z7982 Long term (current) use of aspirin: Secondary | ICD-10-CM | POA: Diagnosis not present

## 2020-10-18 DIAGNOSIS — E785 Hyperlipidemia, unspecified: Secondary | ICD-10-CM | POA: Insufficient documentation

## 2020-10-18 DIAGNOSIS — R1310 Dysphagia, unspecified: Secondary | ICD-10-CM | POA: Diagnosis not present

## 2020-10-18 DIAGNOSIS — K449 Diaphragmatic hernia without obstruction or gangrene: Secondary | ICD-10-CM | POA: Insufficient documentation

## 2020-10-18 DIAGNOSIS — R1313 Dysphagia, pharyngeal phase: Secondary | ICD-10-CM | POA: Insufficient documentation

## 2020-10-18 DIAGNOSIS — R109 Unspecified abdominal pain: Secondary | ICD-10-CM

## 2020-10-18 DIAGNOSIS — I2510 Atherosclerotic heart disease of native coronary artery without angina pectoris: Secondary | ICD-10-CM | POA: Diagnosis not present

## 2020-10-18 DIAGNOSIS — K317 Polyp of stomach and duodenum: Secondary | ICD-10-CM | POA: Diagnosis not present

## 2020-10-18 DIAGNOSIS — Z79899 Other long term (current) drug therapy: Secondary | ICD-10-CM | POA: Insufficient documentation

## 2020-10-18 DIAGNOSIS — H269 Unspecified cataract: Secondary | ICD-10-CM | POA: Diagnosis not present

## 2020-10-18 DIAGNOSIS — J9811 Atelectasis: Secondary | ICD-10-CM | POA: Diagnosis not present

## 2020-10-18 DIAGNOSIS — K3189 Other diseases of stomach and duodenum: Secondary | ICD-10-CM | POA: Diagnosis not present

## 2020-10-18 HISTORY — DX: Atherosclerotic heart disease of native coronary artery without angina pectoris: I25.10

## 2020-10-18 HISTORY — PX: ESOPHAGOGASTRODUODENOSCOPY (EGD) WITH PROPOFOL: SHX5813

## 2020-10-18 HISTORY — DX: Essential (primary) hypertension: I10

## 2020-10-18 SURGERY — ESOPHAGOGASTRODUODENOSCOPY (EGD) WITH PROPOFOL
Anesthesia: General

## 2020-10-18 MED ORDER — PROPOFOL 10 MG/ML IV BOLUS
INTRAVENOUS | Status: DC | PRN
  Administered 2020-10-18: 70 mg via INTRAVENOUS

## 2020-10-18 MED ORDER — PROPOFOL 500 MG/50ML IV EMUL
INTRAVENOUS | Status: DC | PRN
  Administered 2020-10-18: 150 ug/kg/min via INTRAVENOUS

## 2020-10-18 MED ORDER — LIDOCAINE HCL (CARDIAC) PF 100 MG/5ML IV SOSY
PREFILLED_SYRINGE | INTRAVENOUS | Status: DC | PRN
  Administered 2020-10-18: 100 mg via INTRAVENOUS

## 2020-10-18 MED ORDER — SODIUM CHLORIDE 0.9 % IV SOLN: INTRAVENOUS | Status: DC

## 2020-10-18 NOTE — H&P (Signed)
Outpatient short stay form Pre-procedure 10/18/2020 11:33 AM Vue Pavon K. Alice Reichert, M.D.  Primary Physician: Maryland Pink, M.D.  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  History of present illness:  Patient with nondescript pharyngeal dysphagia without esophageal obstruction. Mild GERD. No abdominal pain, melena. Has personal history of duodenal adenoma in 08/2018, followed up in 05/2019 by Dr. Jola Schmidt who received negative biopsies.  , Current Facility-Administered Medications:  .  0.9 %  sodium chloride infusion, , Intravenous, Continuous, [place], Benay Pike, MD, Last Rate: 20 mL/hr at 10/18/20 1043, New Bag at 10/18/20 1043  Medications Prior to Admission  Medication Sig Dispense Refill Last Dose  . allopurinol (ZYLOPRIM) 300 MG tablet Take 300 mg by mouth daily.   Past Week at Unknown time  . amLODipine (NORVASC) 5 MG tablet Take 5 mg by mouth daily.   10/17/2020 at Unknown time  . aspirin 81 MG tablet Take 81 mg by mouth daily.   10/17/2020 at Unknown time  . Cholecalciferol (VITAMIN D) 2000 units CAPS Take 2,000 Units by mouth daily.   10/17/2020 at Unknown time  . Cyanocobalamin (B-12) 3000 MCG CAPS Take 3,000 mcg by mouth daily.   10/17/2020 at Unknown time  . ibuprofen (ADVIL) 200 MG tablet Take 200 mg by mouth every 6 (six) hours as needed.     Marland Kitchen omeprazole (PRILOSEC) 20 MG capsule Take 20 mg by mouth daily.   10/17/2020 at Unknown time  . pravastatin (PRAVACHOL) 40 MG tablet Take 40 mg by mouth daily.   10/17/2020 at Unknown time  . telmisartan (MICARDIS) 80 MG tablet Take 80 mg by mouth daily.   10/17/2020 at Unknown time  . isosorbide mononitrate (IMDUR) 30 MG 24 hr tablet Take 1 tablet (30 mg total) by mouth daily. 30 tablet 11   . losartan (COZAAR) 100 MG tablet Take 100 mg by mouth daily.     Marland Kitchen triamcinolone ointment (KENALOG) 0.5 % Apply 1 application topically daily as needed (rash and itching).         Allergies   Allergen Reactions  . Zocor [Simvastatin] Other (See Comments)    headache     Past Medical History:  Diagnosis Date  . Abdominal pain, acute, left lower quadrant 07/23/2018  . Cataract Left Eye  . Colon polyps   . Coronary artery disease   . Dysphagia   . Encounter for colonoscopy due to history of adenomatous colonic polyps 07/26/2018  . GERD (gastroesophageal reflux disease)   . Gout   . Hiatal hernia   . History of kidney stones   . Hyperlipidemia   . Hypertension   . Kidney stones   . Peyronie's disease     Review of systems:  Otherwise negative.    Physical Exam  Gen: Alert, oriented. Appears stated age.  HEENT: [place]/AT. PERRLA. Lungs: CTA, no wheezes. CV: RR nl S1, S2. Abd: soft, benign, no masses. BS+ Ext: No edema. Pulses 2+    Planned procedures: Proceed with EGD. The patient understands the nature of the planned procedure, indications, risks, alternatives and potential complications including but not limited to bleeding, infection, perforation, damage to internal organs and possible oversedation/side effects from anesthesia. The patient agrees and gives consent to proceed.  Please refer to procedure notes for findings, recommendations and patient disposition/instructions.     Voshon Petro K. Alice Reichert, M.D. Gastroenterology 10/18/2020  11:33 AM

## 2020-10-18 NOTE — Transfer of Care (Signed)
Immediate Anesthesia Transfer of Care Note  Patient: Elwood Bazinet.  Procedure(s) Performed: ESOPHAGOGASTRODUODENOSCOPY (EGD) WITH PROPOFOL (N/A )  Patient Location: Endoscopy Unit  Anesthesia Type:General  Level of Consciousness: drowsy  Airway & Oxygen Therapy: Patient Spontanous Breathing  Post-op Assessment: Report given to RN and Post -op Vital signs reviewed and stable  Post vital signs: Reviewed and stable  Last Vitals:  Vitals Value Taken Time  BP 148/96 10/18/20 1156  Temp    Pulse 74 10/18/20 1157  Resp 18 10/18/20 1157  SpO2 93 % 10/18/20 1157  Vitals shown include unvalidated device data.  Last Pain:  Vitals:   10/18/20 1034  TempSrc: Temporal  PainSc: 0-No pain         Complications: No complications documented.

## 2020-10-18 NOTE — Op Note (Signed)
[facility] Patient Name: Chad Park Procedure Date: 10/18/2020 11:40 AM MRN: [id number] Account #: [id number] Date of Birth: 11-Aug-1940 Admit Type: Outpatient Age: 80 Room: [facility] ENDO ROOM 2 Gender: Male Note Status: Finalized Procedure:             Upper GI endoscopy Indications:           Pharyngeal phase dysphagia, Polyps in the duodenum,                         history of duodenal adenoma Providers:             Benay Pike. Liana Camerer MD, MD Medicines:             Propofol per Anesthesia Complications:         No immediate complications. Procedure:             Pre-Anesthesia Assessment:                        - The risks and benefits of the procedure and the                         sedation options and risks were discussed with the                         patient. All questions were answered and informed                         consent was obtained.                        - Patient identification and proposed procedure were                         verified prior to the procedure by the nurse. The                         procedure was verified in the procedure room.                        - ASA Grade Assessment: III - A patient with severe                         systemic disease.                        - After reviewing the risks and benefits, the patient                         was deemed in satisfactory condition to undergo the                         procedure.                        After obtaining informed consent, the endoscope was                         passed under direct vision. Throughout the procedure,  the patient's blood pressure, pulse, and oxygen                         saturations were monitored continuously. The Endoscope                         was introduced through the mouth, and advanced to the                         third part of duodenum. The upper GI endoscopy was                          accomplished without difficulty. The patient tolerated                         the procedure well. Findings:      No endoscopic abnormality was evident in the esophagus to explain the       patient's complaint of dysphagia. It was decided, however, to proceed       with dilation in the distal esophagus. The scope was withdrawn. Dilation       was performed with a Maloney dilator with no resistance at 52 Fr.      A 2 cm hiatal hernia was present.      A small sessile 3-79mm lesion, consistent with adenoma, with no bleeding       was found in the second portion of the duodenum. Biopsies were taken       with a cold forceps for histology.      The exam was otherwise without abnormality. Impression:            - No endoscopic esophageal abnormality to explain                         patient's dysphagia. Esophagus dilated. Dilated.                        - 2 cm hiatal hernia.                        - Mass (suspected adenoma) in the second portion of                         the duodenum. Biopsied.                        - The examination was otherwise normal. Recommendation:        - Patient has a contact number available for                         emergencies. The signs and symptoms of potential                         delayed complications were discussed with the patient.                         Return to normal activities tomorrow. Written                         discharge instructions were provided to the  patient.                        - Resume previous diet.                        - Continue present medications.                        - Repeat upper endoscopy after studies are complete                         for surveillance.                        - The findings and recommendations were discussed with                         the patient.                        - Return to GI office in 3 months.                        - The findings and recommendations were discussed with                          the patient. Procedure Code(s):     --- Professional ---                        [phone], Esophagogastroduodenoscopy, flexible,                         transoral; with biopsy, single or multiple                        [id number], Dilation of esophagus, by unguided sound or                         bougie, single or multiple passes Diagnosis Code(s):     --- Professional ---                        K44.9, Diaphragmatic hernia without obstruction or                         gangrene                        K31.89, Other diseases of stomach and duodenum                        R13.13, Dysphagia, pharyngeal phase                        K31.7, Polyp of stomach and duodenum CPT copyright 2019 American Medical Association. All rights reserved. The codes documented in this report are preliminary and upon coder review may  be revised to meet current compliance requirements. Efrain Sella MD, MD 10/18/2020 11:57:57 AM This report has been signed electronically. Number of Addenda: 0 Note Initiated On: 10/18/2020 11:40 AM Estimated Blood Loss:  Estimated blood loss: none.      [place]  [facility]

## 2020-10-18 NOTE — Interval H&P Note (Signed)
History and Physical Interval Note:  10/18/2020 11:35 AM  Chad Park.  has presented today for surgery, with the diagnosis of ADEN POLYP OF DUODENUM PHARYNGOESOPHAGEAL DYSPHAGIA.  The various methods of treatment have been discussed with the patient and family. After consideration of risks, benefits and other options for treatment, the patient has consented to  Procedure(s): ESOPHAGOGASTRODUODENOSCOPY (EGD) WITH PROPOFOL (N/A) as a surgical intervention.  The patient's history has been reviewed, patient examined, no change in status, stable for surgery.  I have reviewed the patient's chart and labs.  Questions were answered to the patient's satisfaction.     Playita Cortada, [place]

## 2020-10-18 NOTE — Anesthesia Postprocedure Evaluation (Signed)
Anesthesia Post Note  Patient: Chad Park.  Procedure(s) Performed: ESOPHAGOGASTRODUODENOSCOPY (EGD) WITH PROPOFOL (N/A )  Patient location during evaluation: Endoscopy Anesthesia Type: General Level of consciousness: awake and alert Pain management: pain level controlled Vital Signs Assessment: post-procedure vital signs reviewed and stable Respiratory status: spontaneous breathing, nonlabored ventilation, respiratory function stable and patient connected to nasal cannula oxygen Cardiovascular status: blood pressure returned to baseline and stable Postop Assessment: no apparent nausea or vomiting Anesthetic complications: no   No complications documented.   Last Vitals:  Vitals:   10/18/20 1216 10/18/20 1226  BP: (!) 177/105 (!) 160/91  Pulse: (!) 58 71  Resp: 12 19  Temp:    SpO2: 97% 98%    Last Pain:  Vitals:   10/18/20 1226  TempSrc:   PainSc: 7                  Precious Haws Raegyn Renda

## 2020-10-18 NOTE — Anesthesia Preprocedure Evaluation (Signed)
Anesthesia Evaluation  Patient identified by MRN, date of birth, ID band Patient awake    Reviewed: Allergy & Precautions, H&P , NPO status , Patient's Chart, lab work & pertinent test results  Airway Mallampati: III  TM Distance: >3 FB     Dental  (+) Upper Dentures, Lower Dentures   Pulmonary neg pulmonary ROS, neg COPD, neg recent URI, Patient abstained from smoking., former smoker,           Cardiovascular hypertension, + angina + CAD  (-) Past MI, (-) Cardiac Stents and (-) CABG (-) dysrhythmias      Neuro/Psych negative neurological ROS  negative psych ROS   GI/Hepatic Neg liver ROS, hiatal hernia, GERD  ,  Endo/Other  negative endocrine ROS  Renal/GU Renal disease  negative genitourinary   Musculoskeletal   Abdominal   Peds  Hematology negative hematology ROS (+)   Anesthesia Other Findings Past Medical History: 07/23/2018: Abdominal pain, acute, left lower quadrant Left Eye: Cataract No date: Colon polyps No date: Dysphagia 07/26/2018: Encounter for colonoscopy due to history of adenomatous  colonic polyps No date: GERD (gastroesophageal reflux disease) No date: Gout No date: Hiatal hernia No date: History of kidney stones No date: Hyperlipidemia No date: Kidney stones No date: Peyronie's disease  Past Surgical History: 03/22/2015: COLONOSCOPY WITH PROPOFOL; N/A     Comment:  Procedure: COLONOSCOPY WITH PROPOFOL;  Surgeon: Manya Silvas, MD;  Location: [facility] ENDOSCOPY;  Service:               Endoscopy;  Laterality: N/A; No date: COLONOSCOPY, ESOPHAGOGASTRODUODENOSCOPY (EGD) AND ESOPHAGEAL  DILATION 03/22/2015: ESOPHAGOGASTRODUODENOSCOPY (EGD) WITH PROPOFOL; N/A     Comment:  Procedure: ESOPHAGOGASTRODUODENOSCOPY (EGD) WITH               PROPOFOL;  Surgeon: Manya Silvas, MD;  Location: [facility]              ENDOSCOPY;  Service: Endoscopy;  Laterality: N/A; 11/27/2016: LEFT HEART CATH  AND CORONARY ANGIOGRAPHY; Left     Comment:  Procedure: Left Heart Cath and Coronary Angiography;                Surgeon: Corey Skains, MD;  Location: [place]               CV LAB;  Service: Cardiovascular;  Laterality: Left;     Reproductive/Obstetrics negative OB ROS                             Anesthesia Physical  Anesthesia Plan  ASA: III  Anesthesia Plan: General   Post-op Pain Management:    Induction: Intravenous  PONV Risk Score and Plan: Propofol infusion and TIVA  Airway Management Planned: Nasal Cannula  Additional Equipment:   Intra-op Plan:   Post-operative Plan:   Informed Consent: I have reviewed the patients History and Physical, chart, labs and discussed the procedure including the risks, benefits and alternatives for the proposed anesthesia with the patient or authorized representative who has indicated his/her understanding and acceptance.     Dental Advisory Given  Plan Discussed with: Anesthesiologist and CRNA  Anesthesia Plan Comments: (Patient consented for risks of anesthesia including but not limited to:  - adverse reactions to medications - risk of airway placement if required - damage to eyes, teeth, lips or other oral mucosa - nerve damage due to  positioning  - sore throat or hoarseness - Damage to heart, brain, nerves, lungs, other parts of body or loss of life  Patient voiced understanding.)        Anesthesia Quick Evaluation

## 2020-10-19 ENCOUNTER — Encounter: Payer: Self-pay | Admitting: Internal Medicine

## 2020-10-20 LAB — SURGICAL PATHOLOGY

## 2020-11-08 DIAGNOSIS — Z85828 Personal history of other malignant neoplasm of skin: Secondary | ICD-10-CM | POA: Diagnosis not present

## 2020-11-08 DIAGNOSIS — D2262 Melanocytic nevi of left upper limb, including shoulder: Secondary | ICD-10-CM | POA: Diagnosis not present

## 2020-11-08 DIAGNOSIS — L570 Actinic keratosis: Secondary | ICD-10-CM | POA: Diagnosis not present

## 2020-11-08 DIAGNOSIS — D0462 Carcinoma in situ of skin of left upper limb, including shoulder: Secondary | ICD-10-CM | POA: Diagnosis not present

## 2020-11-08 DIAGNOSIS — D485 Neoplasm of uncertain behavior of skin: Secondary | ICD-10-CM | POA: Diagnosis not present

## 2020-11-08 DIAGNOSIS — D2272 Melanocytic nevi of left lower limb, including hip: Secondary | ICD-10-CM | POA: Diagnosis not present

## 2020-11-08 DIAGNOSIS — D2261 Melanocytic nevi of right upper limb, including shoulder: Secondary | ICD-10-CM | POA: Diagnosis not present

## 2020-11-08 DIAGNOSIS — X32XXXA Exposure to sunlight, initial encounter: Secondary | ICD-10-CM | POA: Diagnosis not present

## 2020-11-08 DIAGNOSIS — D0421 Carcinoma in situ of skin of right ear and external auricular canal: Secondary | ICD-10-CM | POA: Diagnosis not present

## 2020-11-28 DIAGNOSIS — Z961 Presence of intraocular lens: Secondary | ICD-10-CM | POA: Diagnosis not present

## 2020-12-01 DIAGNOSIS — Z125 Encounter for screening for malignant neoplasm of prostate: Secondary | ICD-10-CM | POA: Diagnosis not present

## 2020-12-01 DIAGNOSIS — I10 Essential (primary) hypertension: Secondary | ICD-10-CM | POA: Diagnosis not present

## 2020-12-01 DIAGNOSIS — E7800 Pure hypercholesterolemia, unspecified: Secondary | ICD-10-CM | POA: Diagnosis not present

## 2020-12-04 DIAGNOSIS — G4700 Insomnia, unspecified: Secondary | ICD-10-CM | POA: Diagnosis not present

## 2020-12-04 DIAGNOSIS — Z0000 Encounter for general adult medical examination without abnormal findings: Secondary | ICD-10-CM | POA: Diagnosis not present

## 2020-12-04 DIAGNOSIS — E559 Vitamin D deficiency, unspecified: Secondary | ICD-10-CM | POA: Diagnosis not present

## 2020-12-04 DIAGNOSIS — E538 Deficiency of other specified B group vitamins: Secondary | ICD-10-CM | POA: Diagnosis not present

## 2020-12-04 DIAGNOSIS — R5383 Other fatigue: Secondary | ICD-10-CM | POA: Diagnosis not present

## 2020-12-04 DIAGNOSIS — N1831 Chronic kidney disease, stage 3a: Secondary | ICD-10-CM | POA: Diagnosis not present

## 2020-12-04 DIAGNOSIS — R5381 Other malaise: Secondary | ICD-10-CM | POA: Diagnosis not present

## 2020-12-20 DIAGNOSIS — D0462 Carcinoma in situ of skin of left upper limb, including shoulder: Secondary | ICD-10-CM | POA: Diagnosis not present

## 2020-12-22 DIAGNOSIS — E538 Deficiency of other specified B group vitamins: Secondary | ICD-10-CM | POA: Diagnosis not present

## 2020-12-27 DIAGNOSIS — M25561 Pain in right knee: Secondary | ICD-10-CM | POA: Diagnosis not present

## 2020-12-29 DIAGNOSIS — E538 Deficiency of other specified B group vitamins: Secondary | ICD-10-CM | POA: Diagnosis not present

## 2021-01-02 DIAGNOSIS — H26493 Other secondary cataract, bilateral: Secondary | ICD-10-CM | POA: Diagnosis not present

## 2021-01-02 DIAGNOSIS — H2511 Age-related nuclear cataract, right eye: Secondary | ICD-10-CM | POA: Diagnosis not present

## 2021-01-02 DIAGNOSIS — H18413 Arcus senilis, bilateral: Secondary | ICD-10-CM | POA: Diagnosis not present

## 2021-01-02 DIAGNOSIS — H26491 Other secondary cataract, right eye: Secondary | ICD-10-CM | POA: Diagnosis not present

## 2021-01-02 DIAGNOSIS — H40013 Open angle with borderline findings, low risk, bilateral: Secondary | ICD-10-CM | POA: Diagnosis not present

## 2021-01-02 DIAGNOSIS — Z961 Presence of intraocular lens: Secondary | ICD-10-CM | POA: Diagnosis not present

## 2021-01-03 DIAGNOSIS — D0421 Carcinoma in situ of skin of right ear and external auricular canal: Secondary | ICD-10-CM | POA: Diagnosis not present

## 2021-01-05 DIAGNOSIS — E538 Deficiency of other specified B group vitamins: Secondary | ICD-10-CM | POA: Diagnosis not present

## 2021-01-23 DIAGNOSIS — H26492 Other secondary cataract, left eye: Secondary | ICD-10-CM | POA: Diagnosis not present

## 2021-02-15 DIAGNOSIS — N1831 Chronic kidney disease, stage 3a: Secondary | ICD-10-CM | POA: Insufficient documentation

## 2021-02-15 DIAGNOSIS — E7800 Pure hypercholesterolemia, unspecified: Secondary | ICD-10-CM | POA: Diagnosis not present

## 2021-02-15 DIAGNOSIS — I10 Essential (primary) hypertension: Secondary | ICD-10-CM | POA: Diagnosis not present

## 2021-02-15 DIAGNOSIS — I2510 Atherosclerotic heart disease of native coronary artery without angina pectoris: Secondary | ICD-10-CM | POA: Diagnosis not present

## 2021-07-11 DIAGNOSIS — L570 Actinic keratosis: Secondary | ICD-10-CM | POA: Diagnosis not present

## 2021-07-11 DIAGNOSIS — D2261 Melanocytic nevi of right upper limb, including shoulder: Secondary | ICD-10-CM | POA: Diagnosis not present

## 2021-07-11 DIAGNOSIS — D2262 Melanocytic nevi of left upper limb, including shoulder: Secondary | ICD-10-CM | POA: Diagnosis not present

## 2021-07-11 DIAGNOSIS — X32XXXA Exposure to sunlight, initial encounter: Secondary | ICD-10-CM | POA: Diagnosis not present

## 2021-07-11 DIAGNOSIS — Z85828 Personal history of other malignant neoplasm of skin: Secondary | ICD-10-CM | POA: Diagnosis not present

## 2021-07-11 DIAGNOSIS — D0461 Carcinoma in situ of skin of right upper limb, including shoulder: Secondary | ICD-10-CM | POA: Diagnosis not present

## 2021-07-11 DIAGNOSIS — D485 Neoplasm of uncertain behavior of skin: Secondary | ICD-10-CM | POA: Diagnosis not present

## 2021-07-11 DIAGNOSIS — D2272 Melanocytic nevi of left lower limb, including hip: Secondary | ICD-10-CM | POA: Diagnosis not present

## 2021-07-31 IMAGING — DX DG CHEST 1V PORT
1 series · 1 of 1 positions shown · non-contrast
Comparison: Report of prior chest radiograph February 03, 2020
available; images from that study not available.

CLINICAL DATA: Pain after endoscopy

EXAM:
PORTABLE CHEST 1 VIEW

[chest ap]
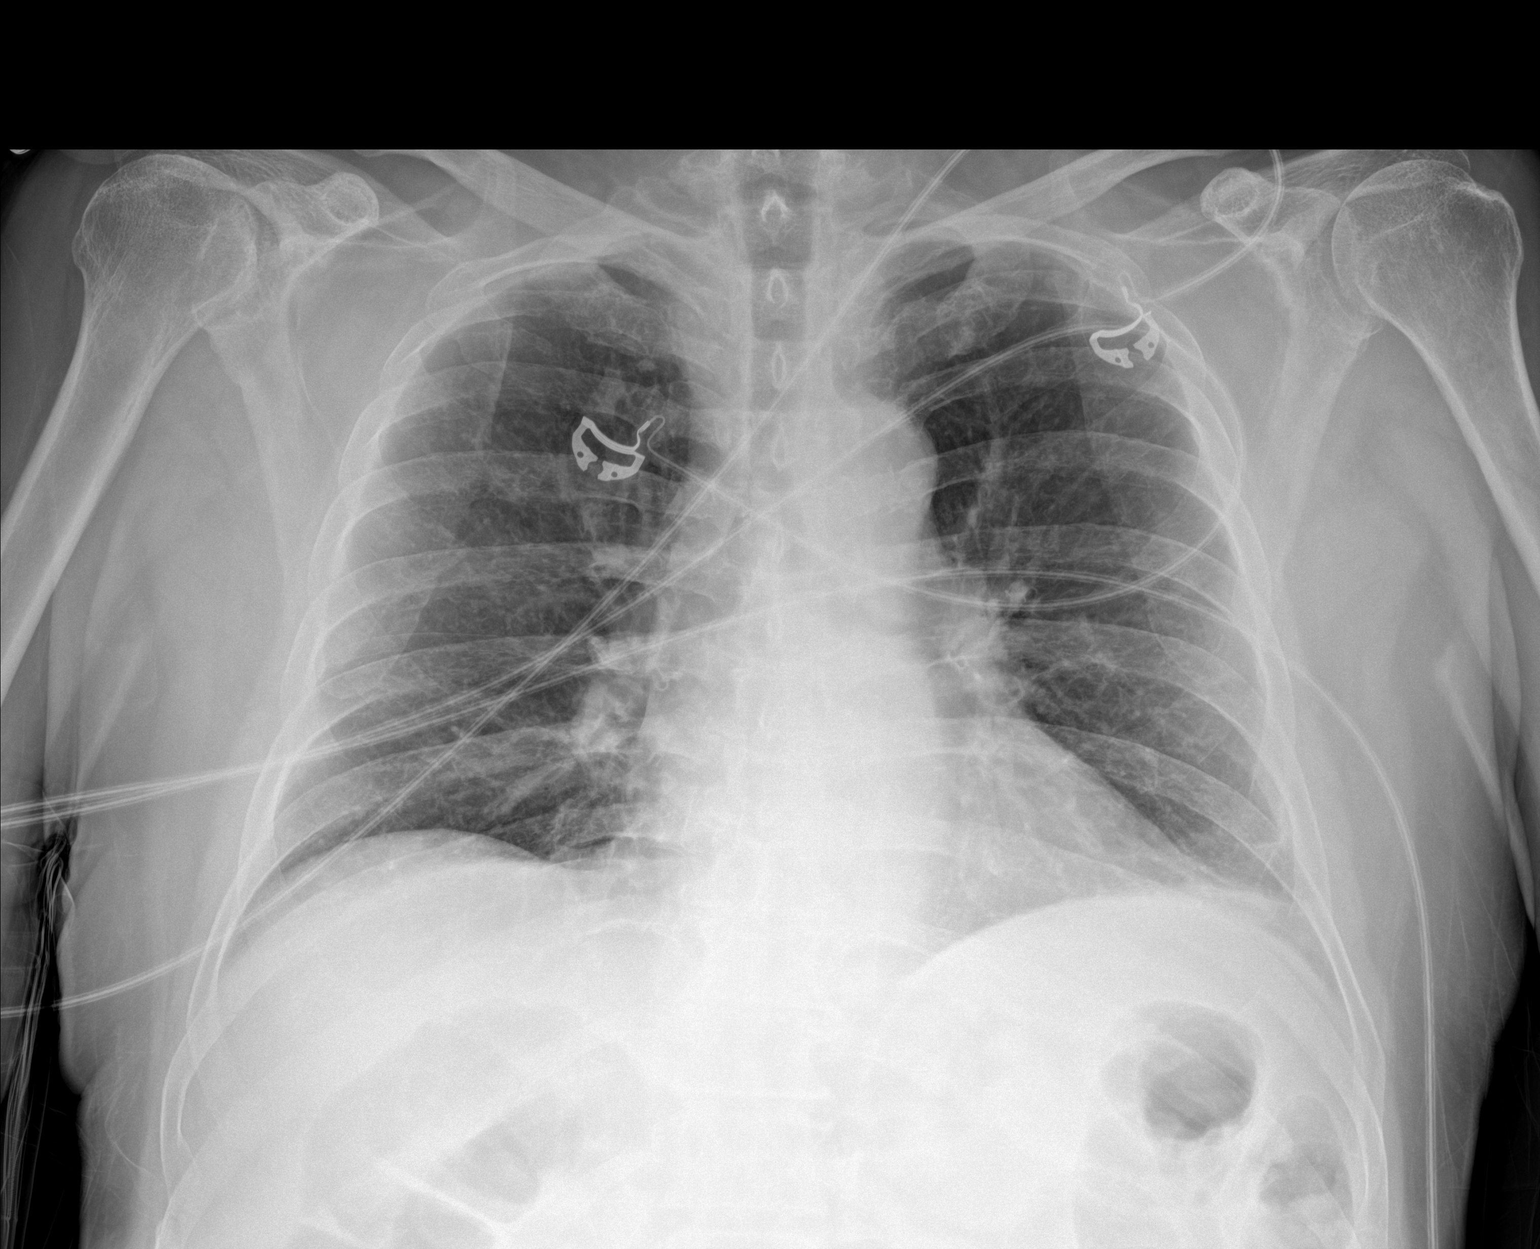

[1 of 1 positions shown; findings below may reference images not displayed]

FINDINGS: Upright portable chest image obtained. There is slight left base
atelectasis. The lungs elsewhere are clear. Heart size and pulmonary
vascular normal. No adenopathy. No evident pneumothorax. No
pneumoperitoneum evident on this study. No bone lesions.
IMPRESSION: Slight left base atelectasis. Lungs otherwise clear. Heart size
normal. No pneumothorax or pneumoperitoneum appreciable on this
study.

## 2021-08-22 DIAGNOSIS — D0461 Carcinoma in situ of skin of right upper limb, including shoulder: Secondary | ICD-10-CM | POA: Diagnosis not present

## 2021-11-16 DIAGNOSIS — M25552 Pain in left hip: Secondary | ICD-10-CM | POA: Diagnosis not present

## 2021-11-28 DIAGNOSIS — Z0000 Encounter for general adult medical examination without abnormal findings: Secondary | ICD-10-CM | POA: Diagnosis not present

## 2021-11-28 DIAGNOSIS — E785 Hyperlipidemia, unspecified: Secondary | ICD-10-CM | POA: Diagnosis not present

## 2021-11-28 DIAGNOSIS — Z125 Encounter for screening for malignant neoplasm of prostate: Secondary | ICD-10-CM | POA: Diagnosis not present

## 2021-11-28 DIAGNOSIS — E538 Deficiency of other specified B group vitamins: Secondary | ICD-10-CM | POA: Diagnosis not present

## 2021-11-30 DIAGNOSIS — T1502XA Foreign body in cornea, left eye, initial encounter: Secondary | ICD-10-CM | POA: Diagnosis not present

## 2021-12-03 ENCOUNTER — Encounter: Payer: Self-pay | Admitting: Ophthalmology

## 2021-12-03 DIAGNOSIS — H2512 Age-related nuclear cataract, left eye: Secondary | ICD-10-CM | POA: Diagnosis not present

## 2021-12-03 DIAGNOSIS — H27122 Anterior dislocation of lens, left eye: Secondary | ICD-10-CM | POA: Diagnosis not present

## 2021-12-03 NOTE — Discharge Instructions (Signed)

## 2021-12-05 ENCOUNTER — Ambulatory Visit: Payer: PPO | Admitting: Anesthesiology

## 2021-12-05 ENCOUNTER — Ambulatory Visit
Admission: RE | Admit: 2021-12-05 | Discharge: 2021-12-05 | Disposition: A | Discharge status: Home / Self Care | Payer: PPO | Attending: Ophthalmology | Admitting: Ophthalmology

## 2021-12-05 ENCOUNTER — Encounter
Admission: RE | Disposition: A | Discharge status: Home / Self Care | Payer: Self-pay | Source: Home / Self Care | Attending: Ophthalmology

## 2021-12-05 ENCOUNTER — Encounter: Payer: Self-pay | Admitting: Ophthalmology

## 2021-12-05 ENCOUNTER — Other Ambulatory Visit: Payer: Self-pay

## 2021-12-05 DIAGNOSIS — K219 Gastro-esophageal reflux disease without esophagitis: Secondary | ICD-10-CM | POA: Diagnosis not present

## 2021-12-05 DIAGNOSIS — H27122 Anterior dislocation of lens, left eye: Secondary | ICD-10-CM | POA: Insufficient documentation

## 2021-12-05 DIAGNOSIS — Z87891 Personal history of nicotine dependence: Secondary | ICD-10-CM | POA: Diagnosis not present

## 2021-12-05 DIAGNOSIS — I25119 Atherosclerotic heart disease of native coronary artery with unspecified angina pectoris: Secondary | ICD-10-CM | POA: Diagnosis not present

## 2021-12-05 DIAGNOSIS — I208 Other forms of angina pectoris: Secondary | ICD-10-CM

## 2021-12-05 DIAGNOSIS — I10 Essential (primary) hypertension: Secondary | ICD-10-CM | POA: Diagnosis not present

## 2021-12-05 HISTORY — DX: Cardiac murmur, unspecified: R01.1

## 2021-12-05 HISTORY — PX: CATARACT EXTRACTION W/PHACO: SHX586

## 2021-12-05 HISTORY — DX: Presence of dental prosthetic device (complete) (partial): Z97.2

## 2021-12-05 SURGERY — PHACOEMULSIFICATION, CATARACT, WITH IOL INSERTION
Anesthesia: Monitor Anesthesia Care | Site: Eye | Laterality: Left

## 2021-12-05 MED ORDER — TRIAMCINOLONE ACETONIDE 40 MG/ML IJ SUSP
INTRAMUSCULAR | Status: DC | PRN
  Administered 2021-12-05: .5 mL via INTRAMUSCULAR

## 2021-12-05 MED ORDER — SIGHTPATH DOSE#1 BSS IO SOLN
INTRAOCULAR | Status: DC | PRN
  Administered 2021-12-05: 15 mL

## 2021-12-05 MED ORDER — SIGHTPATH DOSE#1 BSS IO SOLN
INTRAOCULAR | Status: DC | PRN
  Administered 2021-12-05: 13 mL via OPHTHALMIC

## 2021-12-05 MED ORDER — ACETYLCHOLINE CHLORIDE 20 MG IO SOLR
INTRAOCULAR | Status: DC | PRN
  Administered 2021-12-05: 1.5 mg via INTRAOCULAR

## 2021-12-05 MED ORDER — MIDAZOLAM HCL 2 MG/2ML IJ SOLN
INTRAMUSCULAR | Status: DC | PRN
  Administered 2021-12-05 (×2): 1 mg via INTRAVENOUS

## 2021-12-05 MED ORDER — LACTATED RINGERS IV SOLN: INTRAVENOUS | Status: DC

## 2021-12-05 MED ORDER — FENTANYL CITRATE (PF) 100 MCG/2ML IJ SOLN
INTRAMUSCULAR | Status: DC | PRN
Start: 2021-12-05 — End: 2021-12-05
  Administered 2021-12-05: 50 ug via INTRAVENOUS

## 2021-12-05 MED ORDER — SODIUM HYALURONATE 10 MG/ML IO SOLUTION
PREFILLED_SYRINGE | INTRAOCULAR | Status: DC | PRN
  Administered 2021-12-05: 0.85 mL via INTRAOCULAR

## 2021-12-05 MED ORDER — TETRACAINE HCL 0.5 % OP SOLN
1.0000 [drp] | OPHTHALMIC | Status: DC | PRN
  Administered 2021-12-05 (×3): 1 [drp] via OPHTHALMIC

## 2021-12-05 MED ORDER — BRIMONIDINE TARTRATE-TIMOLOL 0.2-0.5 % OP SOLN
OPHTHALMIC | Status: DC | PRN
  Administered 2021-12-05: 1 [drp] via OPHTHALMIC

## 2021-12-05 MED ORDER — ARMC OPHTHALMIC DILATING DROPS
1.0000 "application " | OPHTHALMIC | Status: DC | PRN
  Administered 2021-12-05 (×3): 1 via OPHTHALMIC

## 2021-12-05 MED ORDER — CEFUROXIME OPHTHALMIC INJECTION 1 MG/0.1 ML
INJECTION | OPHTHALMIC | Status: DC | PRN
  Administered 2021-12-05: 0.1 mL via INTRACAMERAL

## 2021-12-05 MED ORDER — SIGHTPATH DOSE#1 BSS IO SOLN
INTRAOCULAR | Status: DC | PRN
  Administered 2021-12-05: 1 mL via INTRAMUSCULAR

## 2021-12-05 MED ORDER — SIGHTPATH DOSE#1 SODIUM HYALURONATE 10 MG/ML IO SOLUTION
PREFILLED_SYRINGE | INTRAOCULAR | Status: DC | PRN
  Administered 2021-12-05: .85 mL via INTRAOCULAR

## 2021-12-05 SURGICAL SUPPLY — 13 items
CANNULA ANT/CHMB 27G (MISCELLANEOUS) IMPLANT
CANNULA ANT/CHMB 27GA (MISCELLANEOUS) ×4 IMPLANT
CATARACT SUITE SIGHTPATH (MISCELLANEOUS) ×2 IMPLANT
FEE CATARACT SUITE SIGHTPATH (MISCELLANEOUS) ×1 IMPLANT
GLOVE SRG 8 PF TXTR STRL LF DI (GLOVE) ×1 IMPLANT
GLOVE SURG ENC TEXT LTX SZ7.5 (GLOVE) ×2 IMPLANT
GLOVE SURG UNDER POLY LF SZ8 (GLOVE) ×2
NDL FILTER BLUNT 18X1 1/2 (NEEDLE) ×1 IMPLANT
NEEDLE FILTER BLUNT 18X 1/2SAF (NEEDLE) ×1
NEEDLE FILTER BLUNT 18X1 1/2 (NEEDLE) ×1 IMPLANT
PACK VIT ANT 23G (MISCELLANEOUS) ×1 IMPLANT
SYR 3ML LL SCALE MARK (SYRINGE) ×2 IMPLANT
WATER STERILE IRR 250ML POUR (IV SOLUTION) ×2 IMPLANT

## 2021-12-05 NOTE — Transfer of Care (Signed)
Immediate Anesthesia Transfer of Care Note  Patient: Chad Park.  Procedure(s) Performed: REPOSITIONG OF LENS LEFT  (Left: Eye)  Patient Location: PACU  Anesthesia Type: MAC  Level of Consciousness: awake, alert  and patient cooperative  Airway and Oxygen Therapy: Patient Spontanous Breathing and Patient connected to supplemental oxygen  Post-op Assessment: Post-op Vital signs reviewed, Patient's Cardiovascular Status Stable, Respiratory Function Stable, Patent Airway and No signs of Nausea or vomiting  Post-op Vital Signs: Reviewed and stable  Complications: No notable events documented.

## 2021-12-05 NOTE — Anesthesia Postprocedure Evaluation (Signed)
Anesthesia Post Note  Patient: Chad Park.  Procedure(s) Performed: REPOSITIONG OF LENS LEFT  (Left: Eye)     Patient location during evaluation: PACU Anesthesia Type: MAC Level of consciousness: awake and alert Pain management: pain level controlled Vital Signs Assessment: post-procedure vital signs reviewed and stable Respiratory status: spontaneous breathing and nonlabored ventilation Cardiovascular status: blood pressure returned to baseline Postop Assessment: no apparent nausea or vomiting Anesthetic complications: no   No notable events documented.  Dmetrius Ambs [employer]

## 2021-12-05 NOTE — Op Note (Signed)
PREOPERATIVE DIAGNOSIS:  H27.123 Anterior Dislocation of Lens due to trauma  POSTOPERATIVE DIAGNOSIS: H27.123 Anterior Dislocation of Lens                                                       PROCEDURE:  Intraocular lens reposiotion  LEFT eye with anterior vitrectomy through traumatic zonular dialysis    SURGEON:  Wyonia Hough, MD   ANESTHESIA:  Topical with tetracaine drops and 2% Xylocaine jelly, augmented with 1% preservative-free intracameral lidocaine.   COMPLICATIONS:  None.   DESCRIPTION OF PROCEDURE:  The patient was identified in the holding room and transported to the operating room and placed in the supine position under the operating microscope. Theright eye was identified as the operative eye and it was prepped and draped in the usual sterile ophthalmic fashion.   A 1 millimeter clear-corneal paracentesis was made at the 1:30 position. 1% lidocaine was placed into the anterior chamber. The anterior chamber was filled with Healon viscoelastic.  A 2.4 millimeter keratome was used to make a near-clear corneal incision at the 11:00 position. Healon was used to reposition the nasal aspect of the lens form the anterior chamber into the sulcus. It was then used to dilate the capsule bag by placing it under the edge of the capsule. The nasal haptic was able to be positioned within the capsule.  There was significant zonular weakness and dialysis inferiorly and nasally, but the lens bag complex was centered.   Healon was aspirated.  Kenalog was placed into he anterior chamber to check for vitreous.  There was vitreous exending around the inferior and nasal capsule toward the superior wound.  An aditional 12:00 paracentesis was made and anterior vitrectomy was performed in the anterior chamber and through the zonular dialysis inferiorly and nasally.     Miochol was placed in to the eye.  Wounds were checked and there was no vitreous at any incision. The viscoelastic was  aspirated.   Wounds were hydrated with balanced salt solution.  The anterior chamber was inflated to a physiologic pressure with balanced salt solution.  No wound leaks were noted.Cefuroxime 0.1 ml of a '10mg'$ /ml solution was injected into the anterior chamber for a dose of 1 mg of intracameral antibiotic at the completion of the case. Timolol and Brimonidine drops were applied to the eye.  The eye was shielded. The patient was taken to the recovery room in stable condition without complications of anesthesia or surgery.

## 2021-12-05 NOTE — H&P (Signed)
[facility]   Primary Care Physician:  [place] Pink, MD Ophthalmologist: Dr. Leandrew Koyanagi  Pre-Procedure History & Physical: HPI:  Chad Broker. is a 81 y.o. male here for ophthalmic surgery.   Past Medical History:  Diagnosis Date   Abdominal pain, acute, left lower quadrant 07/23/2018   Cataract Left Eye   Colon polyps    Coronary artery disease    Dysphagia    Encounter for colonoscopy due to history of adenomatous colonic polyps 07/26/2018   GERD (gastroesophageal reflux disease)    Gout    Heart murmur    Hiatal hernia    History of kidney stones    Hyperlipidemia    Hypertension    Kidney stones    Peyronie's disease    Wears dentures    full upper and lower    Past Surgical History:  Procedure Laterality Date   COLONOSCOPY WITH PROPOFOL N/A 03/22/2015   Procedure: COLONOSCOPY WITH PROPOFOL;  Surgeon: Manya Silvas, MD;  Location: [place];  Service: Endoscopy;  Laterality: N/A;   COLONOSCOPY WITH PROPOFOL N/A 09/07/2018   Procedure: COLONOSCOPY WITH PROPOFOL;  Surgeon: Manya Silvas, MD;  Location: [facility] ENDOSCOPY;  Service: Endoscopy;  Laterality: N/A;   COLONOSCOPY, ESOPHAGOGASTRODUODENOSCOPY (EGD) AND ESOPHAGEAL DILATION     ESOPHAGOGASTRODUODENOSCOPY (EGD) WITH PROPOFOL N/A 03/22/2015   Procedure: ESOPHAGOGASTRODUODENOSCOPY (EGD) WITH PROPOFOL;  Surgeon: Manya Silvas, MD;  Location: [facility] ENDOSCOPY;  Service: Endoscopy;  Laterality: N/A;   ESOPHAGOGASTRODUODENOSCOPY (EGD) WITH PROPOFOL N/A 09/07/2018   Procedure: ESOPHAGOGASTRODUODENOSCOPY (EGD) WITH PROPOFOL;  Surgeon: Manya Silvas, MD;  Location: [facility] ENDOSCOPY;  Service: Endoscopy;  Laterality: N/A;   ESOPHAGOGASTRODUODENOSCOPY (EGD) WITH PROPOFOL N/A 10/18/2020   Procedure: ESOPHAGOGASTRODUODENOSCOPY (EGD) WITH PROPOFOL;  Surgeon: [place], Benay Pike, MD;  Location: [facility] ENDOSCOPY;  Service: Gastroenterology;  Laterality: N/A;   LEFT HEART CATH AND CORONARY ANGIOGRAPHY Left  11/27/2016   Procedure: Left Heart Cath and Coronary Angiography;  Surgeon: Corey Skains, MD;  Location: [place] CV LAB;  Service: Cardiovascular;  Laterality: Left;   SAVORY DILATION N/A 09/07/2018   Procedure: SAVORY DILATION;  Surgeon: Manya Silvas, MD;  Location: [facility] ENDOSCOPY;  Service: Endoscopy;  Laterality: N/A;    Prior to Admission medications   Medication Sig Start Date End Date Taking? Authorizing Provider  amLODipine (NORVASC) 5 MG tablet Take 5 mg by mouth daily.   Yes [provider]  aspirin 81 MG tablet Take 81 mg by mouth daily.   Yes [provider]  Cholecalciferol (VITAMIN D) 2000 units CAPS Take 2,000 Units by mouth daily.   Yes [provider]  Cyanocobalamin (B-12) 3000 MCG CAPS Take 3,000 mcg by mouth daily.   Yes [provider]  ibuprofen (ADVIL) 200 MG tablet Take 200 mg by mouth every 6 (six) hours as needed.   Yes [provider]  omeprazole (PRILOSEC) 20 MG capsule Take 20 mg by mouth daily.   Yes [provider]  pravastatin (PRAVACHOL) 40 MG tablet Take 80 mg by mouth daily.   Yes [provider]  telmisartan (MICARDIS) 80 MG tablet Take 80 mg by mouth daily.   Yes [provider]  allopurinol (ZYLOPRIM) 300 MG tablet Take 300 mg by mouth daily. Patient not taking: Reported on 12/03/2021    [provider]  triamcinolone ointment (KENALOG) 0.5 % Apply 1 application topically daily as needed (rash and itching).  12/27/15   [provider]    Allergies as of 12/03/2021 -  Review Complete 12/03/2021  Allergen Reaction Noted   Zocor [simvastatin] Other (See Comments) 03/21/2015   Doxycycline Rash 12/03/2021    Family History  Problem Relation Age of Onset   Pulmonary embolism Mother    Lung cancer Father    Lung cancer Brother     Social History   Socioeconomic History   Marital status: Married    Spouse name: Not on file   Number of children: Not on  file   Years of education: Not on file   Highest education level: Not on file  Occupational History   Not on file  Tobacco Use   Smoking status: Former    Years: 20.00    Types: Cigarettes   Smokeless tobacco: Current    Types: Chew   Tobacco comments:    quit 30 years ago   Vaping Use   Vaping Use: Never used  Substance and Sexual Activity   Alcohol use: Yes    Alcohol/week: 2.0 - 3.0 standard drinks of alcohol    Types: 2 - 3 Cans of beer per week   Drug use: No   Sexual activity: Not on file  Other Topics Concern   Not on file  Social History Narrative   Not on file   Social Determinants of Health   Financial Resource Strain: Not on file  Food Insecurity: Not on file  Transportation Needs: Not on file  Physical Activity: Not on file  Stress: Not on file  Social Connections: Not on file  Intimate Partner Violence: Not on file    Review of Systems: See HPI, otherwise negative ROS  Physical Exam: BP 130/90   Pulse 86   Temp 97.9 F (36.6 C) (Temporal)   Ht '5\' 4"'$  (1.626 m)   Wt 72.4 kg   SpO2 94%   BMI 27.40 kg/m  General:   Alert,  pleasant and cooperative in NAD Head:  Normocephalic and atraumatic. Lungs:  Clear to auscultation.    Heart:  Regular rate and rhythm.   Impression/Plan: Chad Elisha Ponder. is here for ophthalmic surgery.  Risks, benefits, limitations, and alternatives regarding ophthalmic surgery have been reviewed with the patient.  Questions have been answered.  All parties agreeable.   Leandrew Koyanagi, MD  12/05/2021, 1:14 PM

## 2021-12-05 NOTE — Anesthesia Preprocedure Evaluation (Signed)
Anesthesia Evaluation  Patient identified by MRN, date of birth, ID band Patient awake    Reviewed: Allergy & Precautions, H&P , NPO status , Patient's Chart, lab work & pertinent test results  Airway Mallampati: III  TM Distance: >3 FB     Dental  (+) Upper Dentures, Lower Dentures   Pulmonary Patient abstained from smoking., former smoker,    Pulmonary exam normal        Cardiovascular hypertension, + angina + CAD  Normal cardiovascular exam     Neuro/Psych negative neurological ROS  negative psych ROS   GI/Hepatic Neg liver ROS, hiatal hernia, GERD  ,  Endo/Other  negative endocrine ROS  Renal/GU Renal InsufficiencyRenal disease  negative genitourinary   Musculoskeletal   Abdominal Normal abdominal exam  (+)   Peds  Hematology negative hematology ROS (+)   Anesthesia Other Findings   Reproductive/Obstetrics negative OB ROS                             Anesthesia Physical  Anesthesia Plan  ASA: 3  Anesthesia Plan: MAC   Post-op Pain Management: Minimal or no pain anticipated   Induction: Intravenous  PONV Risk Score and Plan: 1 and TIVA, Midazolam and Treatment may vary due to age or medical condition  Airway Management Planned: Nasal Cannula and Natural Airway  Additional Equipment:   Intra-op Plan:   Post-operative Plan:   Informed Consent: I have reviewed the patients History and Physical, chart, labs and discussed the procedure including the risks, benefits and alternatives for the proposed anesthesia with the patient or authorized representative who has indicated his/her understanding and acceptance.     Dental advisory given  Plan Discussed with: CRNA  Anesthesia Plan Comments:         Anesthesia Quick Evaluation

## 2021-12-05 NOTE — Anesthesia Procedure Notes (Signed)
Procedure Name: MAC Date/Time: 12/05/2021 2:19 PM  Performed by: Mayme Genta, CRNAPre-anesthesia Checklist: Patient identified, Emergency Drugs available, Suction available, Timeout performed and Patient being monitored Patient Re-evaluated:Patient Re-evaluated prior to induction Oxygen Delivery Method: Nasal cannula Placement Confirmation: positive ETCO2

## 2021-12-06 ENCOUNTER — Encounter: Payer: Self-pay | Admitting: Ophthalmology

## 2021-12-10 DIAGNOSIS — M25552 Pain in left hip: Secondary | ICD-10-CM | POA: Diagnosis not present

## 2021-12-10 DIAGNOSIS — M545 Low back pain, unspecified: Secondary | ICD-10-CM | POA: Diagnosis not present

## 2021-12-10 DIAGNOSIS — M5136 Other intervertebral disc degeneration, lumbar region: Secondary | ICD-10-CM | POA: Diagnosis not present

## 2021-12-28 DIAGNOSIS — M1612 Unilateral primary osteoarthritis, left hip: Secondary | ICD-10-CM | POA: Diagnosis not present

## 2022-02-14 DIAGNOSIS — E7800 Pure hypercholesterolemia, unspecified: Secondary | ICD-10-CM | POA: Diagnosis not present

## 2022-02-14 DIAGNOSIS — I2510 Atherosclerotic heart disease of native coronary artery without angina pectoris: Secondary | ICD-10-CM | POA: Diagnosis not present

## 2022-02-14 DIAGNOSIS — I10 Essential (primary) hypertension: Secondary | ICD-10-CM | POA: Diagnosis not present

## 2022-02-15 DIAGNOSIS — H4032X4 Glaucoma secondary to eye trauma, left eye, indeterminate stage: Secondary | ICD-10-CM | POA: Diagnosis not present

## 2022-03-06 DIAGNOSIS — I2510 Atherosclerotic heart disease of native coronary artery without angina pectoris: Secondary | ICD-10-CM | POA: Diagnosis not present

## 2022-03-20 DIAGNOSIS — I10 Essential (primary) hypertension: Secondary | ICD-10-CM | POA: Diagnosis not present

## 2022-03-20 DIAGNOSIS — I2510 Atherosclerotic heart disease of native coronary artery without angina pectoris: Secondary | ICD-10-CM | POA: Diagnosis not present

## 2022-03-20 DIAGNOSIS — E7800 Pure hypercholesterolemia, unspecified: Secondary | ICD-10-CM | POA: Diagnosis not present

## 2022-04-25 DIAGNOSIS — K219 Gastro-esophageal reflux disease without esophagitis: Secondary | ICD-10-CM | POA: Diagnosis not present

## 2022-04-25 DIAGNOSIS — Z0000 Encounter for general adult medical examination without abnormal findings: Secondary | ICD-10-CM | POA: Diagnosis not present

## 2022-04-25 DIAGNOSIS — I10 Essential (primary) hypertension: Secondary | ICD-10-CM | POA: Diagnosis not present

## 2022-04-25 DIAGNOSIS — E538 Deficiency of other specified B group vitamins: Secondary | ICD-10-CM | POA: Diagnosis not present

## 2022-04-25 DIAGNOSIS — N1831 Chronic kidney disease, stage 3a: Secondary | ICD-10-CM | POA: Diagnosis not present

## 2022-04-25 DIAGNOSIS — Z125 Encounter for screening for malignant neoplasm of prostate: Secondary | ICD-10-CM | POA: Diagnosis not present

## 2022-04-25 DIAGNOSIS — E7800 Pure hypercholesterolemia, unspecified: Secondary | ICD-10-CM | POA: Diagnosis not present

## 2022-04-25 DIAGNOSIS — I2510 Atherosclerotic heart disease of native coronary artery without angina pectoris: Secondary | ICD-10-CM | POA: Diagnosis not present

## 2022-05-24 DIAGNOSIS — H4032X4 Glaucoma secondary to eye trauma, left eye, indeterminate stage: Secondary | ICD-10-CM | POA: Diagnosis not present

## 2022-07-10 DIAGNOSIS — R300 Dysuria: Secondary | ICD-10-CM | POA: Diagnosis not present

## 2022-07-10 DIAGNOSIS — R509 Fever, unspecified: Secondary | ICD-10-CM | POA: Diagnosis not present

## 2022-07-10 DIAGNOSIS — R8290 Unspecified abnormal findings in urine: Secondary | ICD-10-CM | POA: Diagnosis not present

## 2022-07-12 ENCOUNTER — Other Ambulatory Visit: Payer: Self-pay

## 2022-07-12 ENCOUNTER — Emergency Department: Payer: PPO

## 2022-07-12 ENCOUNTER — Inpatient Hospital Stay
Admission: EM | Admit: 2022-07-12 | Discharge: 2022-07-16 | DRG: 871 | Disposition: A | Discharge status: Home / Self Care | Payer: PPO | Attending: Internal Medicine | Admitting: Internal Medicine

## 2022-07-12 DIAGNOSIS — N1831 Chronic kidney disease, stage 3a: Secondary | ICD-10-CM | POA: Diagnosis not present

## 2022-07-12 DIAGNOSIS — R6520 Severe sepsis without septic shock: Secondary | ICD-10-CM | POA: Diagnosis not present

## 2022-07-12 DIAGNOSIS — Z6827 Body mass index (BMI) 27.0-27.9, adult: Secondary | ICD-10-CM

## 2022-07-12 DIAGNOSIS — N486 Induration penis plastica: Secondary | ICD-10-CM | POA: Diagnosis not present

## 2022-07-12 DIAGNOSIS — I2510 Atherosclerotic heart disease of native coronary artery without angina pectoris: Secondary | ICD-10-CM | POA: Diagnosis present

## 2022-07-12 DIAGNOSIS — W19XXXA Unspecified fall, initial encounter: Secondary | ICD-10-CM | POA: Diagnosis present

## 2022-07-12 DIAGNOSIS — A419 Sepsis, unspecified organism: Principal | ICD-10-CM | POA: Diagnosis present

## 2022-07-12 DIAGNOSIS — R0902 Hypoxemia: Secondary | ICD-10-CM | POA: Diagnosis not present

## 2022-07-12 DIAGNOSIS — R000 Tachycardia, unspecified: Secondary | ICD-10-CM | POA: Diagnosis not present

## 2022-07-12 DIAGNOSIS — B9620 Unspecified Escherichia coli [E. coli] as the cause of diseases classified elsewhere: Secondary | ICD-10-CM | POA: Diagnosis present

## 2022-07-12 DIAGNOSIS — E663 Overweight: Secondary | ICD-10-CM | POA: Diagnosis present

## 2022-07-12 DIAGNOSIS — R6521 Severe sepsis with septic shock: Secondary | ICD-10-CM | POA: Diagnosis not present

## 2022-07-12 DIAGNOSIS — K5909 Other constipation: Secondary | ICD-10-CM | POA: Diagnosis not present

## 2022-07-12 DIAGNOSIS — K353 Acute appendicitis with localized peritonitis, without perforation or gangrene: Principal | ICD-10-CM

## 2022-07-12 DIAGNOSIS — E876 Hypokalemia: Secondary | ICD-10-CM | POA: Diagnosis not present

## 2022-07-12 DIAGNOSIS — R509 Fever, unspecified: Secondary | ICD-10-CM | POA: Diagnosis not present

## 2022-07-12 DIAGNOSIS — N179 Acute kidney failure, unspecified: Secondary | ICD-10-CM | POA: Diagnosis not present

## 2022-07-12 DIAGNOSIS — R109 Unspecified abdominal pain: Secondary | ICD-10-CM | POA: Diagnosis present

## 2022-07-12 DIAGNOSIS — R0602 Shortness of breath: Secondary | ICD-10-CM | POA: Diagnosis not present

## 2022-07-12 DIAGNOSIS — A049 Bacterial intestinal infection, unspecified: Secondary | ICD-10-CM | POA: Diagnosis not present

## 2022-07-12 DIAGNOSIS — Z043 Encounter for examination and observation following other accident: Secondary | ICD-10-CM | POA: Diagnosis not present

## 2022-07-12 DIAGNOSIS — N281 Cyst of kidney, acquired: Secondary | ICD-10-CM | POA: Diagnosis not present

## 2022-07-12 DIAGNOSIS — N200 Calculus of kidney: Secondary | ICD-10-CM | POA: Diagnosis not present

## 2022-07-12 DIAGNOSIS — K5900 Constipation, unspecified: Secondary | ICD-10-CM | POA: Diagnosis not present

## 2022-07-12 DIAGNOSIS — E860 Dehydration: Secondary | ICD-10-CM | POA: Diagnosis present

## 2022-07-12 DIAGNOSIS — R296 Repeated falls: Secondary | ICD-10-CM | POA: Diagnosis present

## 2022-07-12 DIAGNOSIS — R1030 Lower abdominal pain, unspecified: Secondary | ICD-10-CM | POA: Diagnosis not present

## 2022-07-12 DIAGNOSIS — M109 Gout, unspecified: Secondary | ICD-10-CM | POA: Diagnosis not present

## 2022-07-12 DIAGNOSIS — I129 Hypertensive chronic kidney disease with stage 1 through stage 4 chronic kidney disease, or unspecified chronic kidney disease: Secondary | ICD-10-CM | POA: Diagnosis present

## 2022-07-12 DIAGNOSIS — R531 Weakness: Secondary | ICD-10-CM | POA: Diagnosis not present

## 2022-07-12 DIAGNOSIS — N390 Urinary tract infection, site not specified: Secondary | ICD-10-CM | POA: Diagnosis not present

## 2022-07-12 DIAGNOSIS — K449 Diaphragmatic hernia without obstruction or gangrene: Secondary | ICD-10-CM | POA: Diagnosis present

## 2022-07-12 DIAGNOSIS — J9601 Acute respiratory failure with hypoxia: Secondary | ICD-10-CM | POA: Diagnosis not present

## 2022-07-12 DIAGNOSIS — Z720 Tobacco use: Secondary | ICD-10-CM

## 2022-07-12 DIAGNOSIS — K8020 Calculus of gallbladder without cholecystitis without obstruction: Secondary | ICD-10-CM | POA: Diagnosis not present

## 2022-07-12 DIAGNOSIS — K5730 Diverticulosis of large intestine without perforation or abscess without bleeding: Secondary | ICD-10-CM | POA: Diagnosis not present

## 2022-07-12 DIAGNOSIS — Z1152 Encounter for screening for COVID-19: Secondary | ICD-10-CM

## 2022-07-12 DIAGNOSIS — K219 Gastro-esophageal reflux disease without esophagitis: Secondary | ICD-10-CM

## 2022-07-12 DIAGNOSIS — Z888 Allergy status to other drugs, medicaments and biological substances status: Secondary | ICD-10-CM

## 2022-07-12 DIAGNOSIS — Z9842 Cataract extraction status, left eye: Secondary | ICD-10-CM

## 2022-07-12 DIAGNOSIS — Y929 Unspecified place or not applicable: Secondary | ICD-10-CM

## 2022-07-12 DIAGNOSIS — R1010 Upper abdominal pain, unspecified: Secondary | ICD-10-CM | POA: Diagnosis not present

## 2022-07-12 DIAGNOSIS — Z961 Presence of intraocular lens: Secondary | ICD-10-CM | POA: Diagnosis present

## 2022-07-12 DIAGNOSIS — I700 Atherosclerosis of aorta: Secondary | ICD-10-CM | POA: Diagnosis not present

## 2022-07-12 DIAGNOSIS — R4182 Altered mental status, unspecified: Secondary | ICD-10-CM | POA: Diagnosis not present

## 2022-07-12 DIAGNOSIS — E785 Hyperlipidemia, unspecified: Secondary | ICD-10-CM | POA: Diagnosis present

## 2022-07-12 LAB — CBC WITH DIFFERENTIAL/PLATELET
Abs Immature Granulocytes: 0.06 10*3/uL (ref 0.00–0.07)
Basophils Absolute: 0 10*3/uL (ref 0.0–0.1)
Basophils Relative: 1 %
Eosinophils Absolute: 0 10*3/uL (ref 0.0–0.5)
Eosinophils Relative: 0 %
HCT: 43.5 % (ref 39.0–52.0)
Hemoglobin: 14.6 g/dL (ref 13.0–17.0)
Immature Granulocytes: 1 %
Lymphocytes Relative: 4 %
Lymphs Abs: 0.3 10*3/uL — ABNORMAL LOW (ref 0.7–4.0)
MCH: 30.9 pg (ref 26.0–34.0)
MCHC: 33.6 g/dL (ref 30.0–36.0)
MCV: 92.2 fL (ref 80.0–100.0)
Monocytes Absolute: 0.4 10*3/uL (ref 0.1–1.0)
Monocytes Relative: 5 %
Neutro Abs: 7 10*3/uL (ref 1.7–7.7)
Neutrophils Relative %: 89 %
Platelets: 184 10*3/uL (ref 150–400)
RBC: 4.72 MIL/uL (ref 4.22–5.81)
RDW: 13.5 % (ref 11.5–15.5)
WBC: 7.8 10*3/uL (ref 4.0–10.5)
nRBC: 0 % (ref 0.0–0.2)

## 2022-07-12 MED ORDER — LACTATED RINGERS IV BOLUS
1000.0000 mL | Freq: Once | INTRAVENOUS | Status: AC
  Administered 2022-07-12: 1000 mL via INTRAVENOUS

## 2022-07-12 MED ORDER — ACETAMINOPHEN 500 MG PO TABS
1000.0000 mg | ORAL_TABLET | Freq: Once | ORAL | Status: AC
  Administered 2022-07-12: 1000 mg via ORAL
  Filled 2022-07-12: qty 2

## 2022-07-12 NOTE — ED Notes (Signed)
ED Provider at bedside. 

## 2022-07-12 NOTE — ED Triage Notes (Signed)
Per EMS pt was dx with a UTI and started on ABX yesterday. Pt fell today and per family pt is not at baseline for ambulation. Pt is tachycardic and febrile at this time. Pt has a nonreactive L pupil that is dilated, sts this is normal for him. No blood thinners on board.

## 2022-07-12 NOTE — ED Provider Notes (Signed)
[facility] Provider Note    Event Date/Time   First MD Initiated Contact with Patient 07/12/22 2315     (approximate)   History   Abdominal Pain, Urinary Frequency, and Fall   HPI  Chad Park. is a 82 y.o. male who presents to the ED for evaluation of Abdominal Pain, Urinary Frequency, and Fall   I review 9/27 cardiology clinic visit. Hx HTN, HLD, GERD I review 6/14 ophtho op note, traumatic anterior lens dislocation on the left.   Pt presents to the ED from home for evaluation of weakness, fall, abdominal pain. Patient isn't sure why he is here. Reports someone came by the house to bring him to the hospital.   Son later arrives and provides supplemental hx. Reports patient became more weak and not eating on Tuesday of this week. Got a urine cup from PCP, told he had a UTI and started antibiotics. ABX started Wednesday or Thursday. Not eating much all week. [place] again today, they don't think he hurt anything.    Physical Exam   Triage Vital Signs: ED Triage Vitals  Enc Vitals Group     BP 07/12/22 2325 135/70     Pulse Rate 07/12/22 2325 (!) 125     Resp 07/12/22 2325 (!) 24     Temp 07/12/22 2325 (!) 103.1 F (39.5 C)     Temp Source 07/12/22 2325 Oral     SpO2 07/12/22 2325 96 %     Weight 07/12/22 2326 158 lb (71.7 kg)     Height 07/12/22 2326 '5\' 4"'$  (1.626 m)     Head Circumference --      Peak Flow --      Pain Score 07/12/22 2326 0     Pain Loc --      Pain Edu? --      Excl. in [place]? --     Most recent vital signs: Vitals:   07/13/22 0255 07/13/22 0300  BP:  95/68  Pulse:  87  Resp:  19  Temp: 98.2 F (36.8 C)   SpO2:  95%    General: Awake, no distress. Follows simple command in all 4 extremities, no apparent deficit. Dilated left pupil. Oriented to year, location CV:  Good peripheral perfusion. Tachy and regular Resp:  Normal effort. Tachypneic with clear lungs Abd:  No distention. Diffusely tender throughout the  lower abdomen, most pronounced to RLQ.  Upper abdomen seems benign. MSK:  No deformity noted. Moving all 4. No signs of trauma or impaired ROM Neuro:  No focal deficits appreciated.   Aside from dilated and nonreactive left pupil, Cranial nerves II through XII intact 5/5 strength and sensation in all 4 extremities Other:     ED Results / Procedures / Treatments   Labs (all labs ordered are listed, but only abnormal results are displayed) Labs Reviewed  LACTIC ACID, PLASMA - Abnormal; Notable for the following components:      Result Value   Lactic Acid, Venous 2.5 (*)    All other components within normal limits  COMPREHENSIVE METABOLIC PANEL - Abnormal; Notable for the following components:   Potassium 3.0 (*)    CO2 19 (*)    Glucose, Bld 132 (*)    Creatinine, Ser 1.31 (*)    Calcium 8.7 (*)    AST 66 (*)    ALT 51 (*)    Total Bilirubin 1.3 (*)    GFR, Estimated 55 (*)    All  other components within normal limits  CBC WITH DIFFERENTIAL/PLATELET - Abnormal; Notable for the following components:   Lymphs Abs 0.3 (*)    All other components within normal limits  PROTIME-INR - Abnormal; Notable for the following components:   Prothrombin Time 15.5 (*)    All other components within normal limits  APTT - Abnormal; Notable for the following components:   aPTT 37 (*)    All other components within normal limits  URINALYSIS, COMPLETE (UACMP) WITH MICROSCOPIC - Abnormal; Notable for the following components:   Color, Urine YELLOW (*)    APPearance CLEAR (*)    Specific Gravity, Urine 1.035 (*)    Leukocytes,Ua TRACE (*)    All other components within normal limits  RESP PANEL BY RT-PCR (RSV, FLU A&B, COVID)  [id number]  CULTURE, BLOOD (ROUTINE X 2)  CULTURE, BLOOD (ROUTINE X 2)  URINE CULTURE  LACTIC ACID, PLASMA  PROCALCITONIN    EKG Sinus tachycardia, rate of 124 bpm, normal axis and intervals. No clear signs of acute ischemia  RADIOLOGY CXR interpreted by me without  evidence of acute cardiopulmonary pathology. CT head interpreted by me without evidence of acute intracranial pathology CT abdomen/pelvis interpreted by me with no signs of SBO.  Possible appendiceal inflammation  Official radiology report(s): CT ABDOMEN PELVIS W CONTRAST  Result Date: 07/13/2022 CLINICAL DATA:  Lower abdominal pain and sepsis. EXAM: CT ABDOMEN AND PELVIS WITH CONTRAST TECHNIQUE: Multidetector CT imaging of the abdomen and pelvis was performed using the standard protocol following bolus administration of intravenous contrast. RADIATION DOSE REDUCTION: This exam was performed according to the departmental dose-optimization program which includes automated exposure control, adjustment of the mA and/or kV according to patient size and/or use of iterative reconstruction technique. CONTRAST:  147m OMNIPAQUE IOHEXOL 300 MG/ML  SOLN COMPARISON:  CT abdomen pelvis dated 02/25/2019. FINDINGS: Lower chest: Minimal bibasilar subpleural atelectasis. The visualized lung bases are otherwise clear. There is a 3 mm nodule along the minor fissure (10/4). Coronary vascular calcification of the LAD. No intra-abdominal free air or free fluid. Hepatobiliary: The liver is unremarkable. There is a small stone in the neck of the gallbladder. No pericholecystic fluid. No biliary ductal dilatation. Pancreas: Unremarkable. No pancreatic ductal dilatation or surrounding inflammatory changes. Spleen: Normal in size without focal abnormality. Adrenals/Urinary Tract: The adrenal glands are unremarkable. Small left renal inferior pole cysts. No imaging follow-up. There is no hydronephrosis on either side. There is symmetric enhancement and excretion of contrast by both kidneys. The visualized ureters and urinary bladder appear unremarkable. Stomach/Bowel: There is sigmoid diverticulosis without active inflammatory changes. There is no bowel obstruction. There is slight increased enhancement of the appendiceal mucosa. The  appendix measures up to 9 mm in thickness. No significant inflammatory changes adjacent to the appendix. An early acute appendicitis is less likely but not excluded clinical correlation is recommended. Vascular/Lymphatic: Mild aortoiliac atherosclerotic disease. The IVC is unremarkable. No portal venous gas. There is no adenopathy. Reproductive: Slight heterogeneity of the prostate gland. Subcentimeter hypodense focus in the left prostate noted which may represent a focal area of inflammation/prostatitis. Underlying nodule/neoplasm is not excluded. Correlation with clinical exam and PSA levels recommended. Dedicated prostate MRI or transrectal ultrasound may provide better evaluation of the prostate on a nonemergent/outpatient basis, if clinically indicated. Other: Small fat containing left inguinal hernia. Musculoskeletal: Degenerative changes of the spine. No acute osseous pathology. IMPRESSION: 1. Slight increased enhancement of the appendiceal mucosa without other CT findings of acute appendicitis. An early acute appendicitis  is less likely but not excluded clinical correlation is recommended. 2. Sigmoid diverticulosis without active inflammatory changes. No bowel obstruction. 3. Cholelithiasis. 4. Heterogeneity of the prostate gland. Clinical correlation is recommended to evaluate for possibility of prostatitis. 5. A 3 mm nodule along the minor fissure. No follow-up needed if patient is low-risk.This recommendation follows the consensus statement: Guidelines for Management of Incidental Pulmonary Nodules Detected on CT Images: From the Fleischner Society 2017; Radiology 2017; 284:228-243. 6.  Aortic Atherosclerosis (ICD10-I70.0). Electronically Signed   By: Anner Crete M.D.   On: 07/13/2022 01:01   CT HEAD WO CONTRAST (5MM)  Result Date: 07/13/2022 CLINICAL DATA:  Altered mental status, left pupillary dilation EXAM: CT HEAD WITHOUT CONTRAST TECHNIQUE: Contiguous axial images were obtained from the base  of the skull through the vertex without intravenous contrast. RADIATION DOSE REDUCTION: This exam was performed according to the departmental dose-optimization program which includes automated exposure control, adjustment of the mA and/or kV according to patient size and/or use of iterative reconstruction technique. COMPARISON:  None Available. FINDINGS: Brain: Normal anatomic configuration. Parenchymal volume loss is commensurate with the patient's age. Mild periventricular white matter changes are present likely reflecting the sequela of small vessel ischemia. No abnormal intra or extra-axial mass lesion or fluid collection. No abnormal mass effect or midline shift. No evidence of acute intracranial hemorrhage or infarct. Ventricular size is normal. Cerebellum unremarkable. Vascular: No asymmetric hyperdense vasculature at the skull base. Skull: Intact Sinuses/Orbits: Paranasal sinuses are clear. Orbits are unremarkable. Other: Mastoid air cells and middle ear cavities are clear. IMPRESSION: 1. No acute intracranial hemorrhage or infarct. 2. Mild senescent change. Electronically Signed   By: Fidela Salisbury M.D.   On: 07/13/2022 00:59   DG Chest Port 1 View  Result Date: 07/12/2022 CLINICAL DATA:  Status post fall. EXAM: PORTABLE CHEST 1 VIEW COMPARISON:  October 18, 2020 FINDINGS: The heart size and mediastinal contours are within normal limits. There is no evidence of an acute infiltrate, pleural effusion or pneumothorax. The visualized skeletal structures are unremarkable. IMPRESSION: No active cardiopulmonary disease. Electronically Signed   By: Virgina Norfolk M.D.   On: 07/12/2022 23:54    PROCEDURES and INTERVENTIONS:  .1-3 Lead EKG Interpretation  Performed by: Vladimir Crofts, MD Authorized by: Vladimir Crofts, MD     Interpretation: abnormal     ECG rate:  110   ECG rate assessment: tachycardic     Rhythm: sinus tachycardia     Ectopy: none     Conduction: normal   .Critical Care  Performed  by: Vladimir Crofts, MD Authorized by: Vladimir Crofts, MD   Critical care provider statement:    Critical care time (minutes):  30   Critical care time was exclusive of:  Separately billable procedures and treating other patients   Critical care was necessary to treat or prevent imminent or life-threatening deterioration of the following conditions:  Sepsis   Critical care was time spent personally by me on the following activities:  Development of treatment plan with patient or surrogate, discussions with consultants, evaluation of patient's response to treatment, examination of patient, ordering and review of laboratory studies, ordering and review of radiographic studies, ordering and performing treatments and interventions, pulse oximetry, re-evaluation of patient's condition and review of old charts   Medications  lactated ringers bolus 1,000 mL (0 mLs Intravenous Hold 07/13/22 0300)  heparin injection 5,000 Units (has no administration in time range)  acetaminophen (TYLENOL) tablet 1,000 mg (has no administration in time range)  piperacillin-tazobactam (ZOSYN) IVPB 3.375 g (has no administration in time range)  oxyCODONE (Oxy IR/ROXICODONE) immediate release tablet 5-10 mg (has no administration in time range)  ketorolac (TORADOL) 15 MG/ML injection 15 mg (has no administration in time range)  morphine (PF) 2 MG/ML injection 2 mg (has no administration in time range)  ondansetron (ZOFRAN-ODT) disintegrating tablet 4 mg (has no administration in time range)    Or  ondansetron (ZOFRAN) injection 4 mg (has no administration in time range)  pantoprazole (PROTONIX) injection 40 mg (has no administration in time range)  acetaminophen (TYLENOL) tablet 1,000 mg (1,000 mg Oral Given 07/12/22 2340)  lactated ringers bolus 1,000 mL (0 mLs Intravenous Stopped 07/13/22 0100)  iohexol (OMNIPAQUE) 300 MG/ML solution 100 mL (100 mLs Intravenous Contrast Given 07/13/22 0035)  lactated ringers bolus 1,000 mL (0  mLs Intravenous Stopped 07/13/22 0208)     IMPRESSION / MDM / ASSESSMENT AND PLAN / ED COURSE  I reviewed the triage vital signs and the nursing notes.  Differential diagnosis includes, but is not limited to, appendicitis, pyelonephritis, sepsis, diverticulitis, cholangitis  {Patient presents with symptoms of an acute illness or injury that is potentially life-threatening.  82 year old presents from home with fever and abdominal pain, found to have evidence of sepsis from appendicitis requiring surgical admission.  No significant signs of trauma.  No neurologic deficits.  Tender lower abdomen.  Febrile and septic without signs of shock.  Normal CBC.  Metabolic panel with CKD around baseline, hypokalemia.  None anion gap metabolic acidosis.  Urine is clear and his procalcitonin is elevated.  CT head is reassuring, CXR is clear and CT abdomen/pelvis is indicative of possible appendicitis.  I did have another clear source at this point of his sepsis or symptoms.  I consult surgery who agrees to admit.  Clinical Course as of 07/13/22 0315  Ludwig Clarks Jul 12, 2022  2344 Reassessed. Son at the bedside and provides supplemental hx [DS]  Sat Jul 13, 2022  0233 Reassessed and reexamined.  Explained the concerns for appendicitis.  Page surgery. [DS]  3903 I discuss with Dr. Dahlia Byes. He reviews the chart and pulls up imaging.  [DS]  0239 Will place orders and see the patient shortly for admission. [DS]    Clinical Course User Index [DS] Vladimir Crofts, MD     FINAL CLINICAL IMPRESSION(S) / ED DIAGNOSES   Final diagnoses:  Acute appendicitis with localized peritonitis, without perforation, abscess, or gangrene     Rx / DC Orders   ED Discharge Orders     None        Note:  This document was prepared using Dragon voice recognition software and may include unintentional dictation errors.   Vladimir Crofts, MD 07/13/22 [phone]

## 2022-07-13 ENCOUNTER — Emergency Department: Payer: PPO

## 2022-07-13 ENCOUNTER — Observation Stay: Payer: PPO

## 2022-07-13 DIAGNOSIS — A419 Sepsis, unspecified organism: Secondary | ICD-10-CM | POA: Diagnosis present

## 2022-07-13 DIAGNOSIS — E860 Dehydration: Secondary | ICD-10-CM

## 2022-07-13 DIAGNOSIS — K8020 Calculus of gallbladder without cholecystitis without obstruction: Secondary | ICD-10-CM | POA: Diagnosis not present

## 2022-07-13 DIAGNOSIS — N200 Calculus of kidney: Secondary | ICD-10-CM | POA: Diagnosis not present

## 2022-07-13 DIAGNOSIS — R109 Unspecified abdominal pain: Secondary | ICD-10-CM | POA: Diagnosis present

## 2022-07-13 DIAGNOSIS — R296 Repeated falls: Secondary | ICD-10-CM | POA: Diagnosis present

## 2022-07-13 DIAGNOSIS — K5900 Constipation, unspecified: Secondary | ICD-10-CM | POA: Diagnosis present

## 2022-07-13 DIAGNOSIS — R1030 Lower abdominal pain, unspecified: Secondary | ICD-10-CM | POA: Diagnosis not present

## 2022-07-13 DIAGNOSIS — I700 Atherosclerosis of aorta: Secondary | ICD-10-CM | POA: Diagnosis present

## 2022-07-13 DIAGNOSIS — Z6827 Body mass index (BMI) 27.0-27.9, adult: Secondary | ICD-10-CM | POA: Diagnosis not present

## 2022-07-13 DIAGNOSIS — N179 Acute kidney failure, unspecified: Secondary | ICD-10-CM | POA: Diagnosis not present

## 2022-07-13 DIAGNOSIS — K219 Gastro-esophageal reflux disease without esophagitis: Secondary | ICD-10-CM

## 2022-07-13 DIAGNOSIS — B9620 Unspecified Escherichia coli [E. coli] as the cause of diseases classified elsewhere: Secondary | ICD-10-CM | POA: Diagnosis present

## 2022-07-13 DIAGNOSIS — E876 Hypokalemia: Secondary | ICD-10-CM | POA: Diagnosis not present

## 2022-07-13 DIAGNOSIS — J9601 Acute respiratory failure with hypoxia: Secondary | ICD-10-CM | POA: Diagnosis not present

## 2022-07-13 DIAGNOSIS — R1010 Upper abdominal pain, unspecified: Secondary | ICD-10-CM | POA: Diagnosis not present

## 2022-07-13 DIAGNOSIS — W19XXXA Unspecified fall, initial encounter: Secondary | ICD-10-CM | POA: Diagnosis present

## 2022-07-13 DIAGNOSIS — N486 Induration penis plastica: Secondary | ICD-10-CM | POA: Diagnosis present

## 2022-07-13 DIAGNOSIS — K449 Diaphragmatic hernia without obstruction or gangrene: Secondary | ICD-10-CM | POA: Diagnosis present

## 2022-07-13 DIAGNOSIS — I129 Hypertensive chronic kidney disease with stage 1 through stage 4 chronic kidney disease, or unspecified chronic kidney disease: Secondary | ICD-10-CM | POA: Diagnosis present

## 2022-07-13 DIAGNOSIS — R6521 Severe sepsis with septic shock: Secondary | ICD-10-CM | POA: Diagnosis not present

## 2022-07-13 DIAGNOSIS — Z1152 Encounter for screening for COVID-19: Secondary | ICD-10-CM | POA: Diagnosis not present

## 2022-07-13 DIAGNOSIS — K5909 Other constipation: Secondary | ICD-10-CM | POA: Diagnosis not present

## 2022-07-13 DIAGNOSIS — E785 Hyperlipidemia, unspecified: Secondary | ICD-10-CM | POA: Diagnosis present

## 2022-07-13 DIAGNOSIS — Y929 Unspecified place or not applicable: Secondary | ICD-10-CM | POA: Diagnosis not present

## 2022-07-13 DIAGNOSIS — N390 Urinary tract infection, site not specified: Secondary | ICD-10-CM | POA: Diagnosis present

## 2022-07-13 DIAGNOSIS — R0602 Shortness of breath: Secondary | ICD-10-CM | POA: Diagnosis not present

## 2022-07-13 DIAGNOSIS — A049 Bacterial intestinal infection, unspecified: Secondary | ICD-10-CM | POA: Diagnosis present

## 2022-07-13 DIAGNOSIS — I2510 Atherosclerotic heart disease of native coronary artery without angina pectoris: Secondary | ICD-10-CM | POA: Diagnosis present

## 2022-07-13 DIAGNOSIS — M109 Gout, unspecified: Secondary | ICD-10-CM | POA: Diagnosis present

## 2022-07-13 DIAGNOSIS — N1831 Chronic kidney disease, stage 3a: Secondary | ICD-10-CM | POA: Diagnosis present

## 2022-07-13 DIAGNOSIS — E663 Overweight: Secondary | ICD-10-CM | POA: Diagnosis present

## 2022-07-13 LAB — COMPREHENSIVE METABOLIC PANEL
ALT: 51 U/L — ABNORMAL HIGH (ref 0–44)
AST: 66 U/L — ABNORMAL HIGH (ref 15–41)
Albumin: 3.5 g/dL (ref 3.5–5.0)
Alkaline Phosphatase: 65 U/L (ref 38–126)
Anion gap: 13 (ref 5–15)
BUN: 22 mg/dL (ref 8–23)
CO2: 19 mmol/L — ABNORMAL LOW (ref 22–32)
Calcium: 8.7 mg/dL — ABNORMAL LOW (ref 8.9–10.3)
Chloride: 103 mmol/L (ref 98–111)
Creatinine, Ser: 1.31 mg/dL — ABNORMAL HIGH (ref 0.61–1.24)
GFR, Estimated: 55 mL/min — ABNORMAL LOW (ref >60)
Glucose, Bld: 132 mg/dL — ABNORMAL HIGH (ref 70–99)
Potassium: 3 mmol/L — ABNORMAL LOW (ref 3.5–5.1)
Sodium: 135 mmol/L (ref 135–145)
Total Bilirubin: 1.3 mg/dL — ABNORMAL HIGH (ref 0.3–1.2)
Total Protein: 7.7 g/dL (ref 6.5–8.1)

## 2022-07-13 LAB — BASIC METABOLIC PANEL
Anion gap: 10 (ref 5–15)
BUN: 17 mg/dL (ref 8–23)
CO2: 21 mmol/L — ABNORMAL LOW (ref 22–32)
Calcium: 7.9 mg/dL — ABNORMAL LOW (ref 8.9–10.3)
Chloride: 105 mmol/L (ref 98–111)
Creatinine, Ser: 1.36 mg/dL — ABNORMAL HIGH (ref 0.61–1.24)
GFR, Estimated: 53 mL/min — ABNORMAL LOW (ref >60)
Glucose, Bld: 107 mg/dL — ABNORMAL HIGH (ref 70–99)
Potassium: 3.3 mmol/L — ABNORMAL LOW (ref 3.5–5.1)
Sodium: 136 mmol/L (ref 135–145)

## 2022-07-13 LAB — CBC WITH DIFFERENTIAL/PLATELET
Abs Immature Granulocytes: 0.14 10*3/uL — ABNORMAL HIGH (ref 0.00–0.07)
Basophils Absolute: 0.1 10*3/uL (ref 0.0–0.1)
Basophils Relative: 0 %
Eosinophils Absolute: 0.1 10*3/uL (ref 0.0–0.5)
Eosinophils Relative: 1 %
HCT: 42.7 % (ref 39.0–52.0)
Hemoglobin: 13.7 g/dL (ref 13.0–17.0)
Immature Granulocytes: 1 %
Lymphocytes Relative: 6 %
Lymphs Abs: 0.9 10*3/uL (ref 0.7–4.0)
MCH: 30.9 pg (ref 26.0–34.0)
MCHC: 32.1 g/dL (ref 30.0–36.0)
MCV: 96.4 fL (ref 80.0–100.0)
Monocytes Absolute: 0.4 10*3/uL (ref 0.1–1.0)
Monocytes Relative: 3 %
Neutro Abs: 14 10*3/uL — ABNORMAL HIGH (ref 1.7–7.7)
Neutrophils Relative %: 89 %
Platelets: 195 10*3/uL (ref 150–400)
RBC: 4.43 MIL/uL (ref 4.22–5.81)
RDW: 14.1 % (ref 11.5–15.5)
WBC: 15.6 10*3/uL — ABNORMAL HIGH (ref 4.0–10.5)
nRBC: 0 % (ref 0.0–0.2)

## 2022-07-13 LAB — RESP PANEL BY RT-PCR (RSV, FLU A&B, COVID)  RVPGX2
Influenza A by PCR: NEGATIVE
Influenza B by PCR: NEGATIVE
Resp Syncytial Virus by PCR: NEGATIVE
SARS Coronavirus 2 by RT PCR: NEGATIVE

## 2022-07-13 LAB — LACTIC ACID, PLASMA
Lactic Acid, Venous: 1.9 mmol/L (ref 0.5–1.9)
Lactic Acid, Venous: 2.2 mmol/L (ref 0.5–1.9)
Lactic Acid, Venous: 2.3 mmol/L (ref 0.5–1.9)
Lactic Acid, Venous: 2.5 mmol/L (ref 0.5–1.9)
Lactic Acid, Venous: 2.6 mmol/L (ref 0.5–1.9)
Lactic Acid, Venous: 8.4 mmol/L (ref 0.5–1.9)

## 2022-07-13 LAB — URINALYSIS, COMPLETE (UACMP) WITH MICROSCOPIC
Bacteria, UA: NONE SEEN
Bilirubin Urine: NEGATIVE
Glucose, UA: NEGATIVE mg/dL
Hgb urine dipstick: NEGATIVE
Ketones, ur: NEGATIVE mg/dL
Nitrite: NEGATIVE
Protein, ur: NEGATIVE mg/dL
Specific Gravity, Urine: 1.035 — ABNORMAL HIGH (ref 1.005–1.030)
pH: 5 (ref 5.0–8.0)

## 2022-07-13 LAB — PHOSPHORUS: Phosphorus: 4.1 mg/dL (ref 2.5–4.6)

## 2022-07-13 LAB — APTT: aPTT: 37 seconds — ABNORMAL HIGH (ref 24–36)

## 2022-07-13 LAB — MRSA NEXT GEN BY PCR, NASAL: MRSA by PCR Next Gen: NOT DETECTED

## 2022-07-13 LAB — GLUCOSE, CAPILLARY: Glucose-Capillary: 82 mg/dL (ref 70–99)

## 2022-07-13 LAB — SAMPLE TO BLOOD BANK

## 2022-07-13 LAB — MAGNESIUM: Magnesium: 1.8 mg/dL (ref 1.7–2.4)

## 2022-07-13 LAB — PROCALCITONIN: Procalcitonin: 1.23 ng/mL

## 2022-07-13 LAB — PROTIME-INR
INR: 1.2 (ref 0.8–1.2)
Prothrombin Time: 15.5 seconds — ABNORMAL HIGH (ref 11.4–15.2)

## 2022-07-13 MED ORDER — MORPHINE SULFATE (PF) 2 MG/ML IV SOLN: 2.0000 mg | INTRAVENOUS | Status: DC | PRN

## 2022-07-13 MED ORDER — IPRATROPIUM-ALBUTEROL 0.5-2.5 (3) MG/3ML IN SOLN
3.0000 mL | Freq: Two times a day (BID) | RESPIRATORY_TRACT | Status: DC
  Administered 2022-07-14: 3 mL via RESPIRATORY_TRACT
  Filled 2022-07-13: qty 3

## 2022-07-13 MED ORDER — LACTATED RINGERS IV BOLUS
1000.0000 mL | Freq: Once | INTRAVENOUS | Status: AC
  Administered 2022-07-13: 1000 mL via INTRAVENOUS

## 2022-07-13 MED ORDER — ONDANSETRON 4 MG PO TBDP: 4.0000 mg | ORAL_TABLET | Freq: Four times a day (QID) | ORAL | Status: DC | PRN

## 2022-07-13 MED ORDER — PIPERACILLIN-TAZOBACTAM 3.375 G IVPB 30 MIN
3.3750 g | Freq: Once | INTRAVENOUS | Status: DC
  Administered 2022-07-13: 3.375 g via INTRAVENOUS
  Filled 2022-07-13: qty 50

## 2022-07-13 MED ORDER — PIPERACILLIN-TAZOBACTAM 3.375 G IVPB
3.3750 g | Freq: Three times a day (TID) | INTRAVENOUS | Status: DC
  Administered 2022-07-13 – 2022-07-16 (×10): 3.375 g via INTRAVENOUS
  Filled 2022-07-13 (×10): qty 50

## 2022-07-13 MED ORDER — POTASSIUM CHLORIDE 2 MEQ/ML IV SOLN
INTRAVENOUS | Status: AC
  Filled 2022-07-13 (×2): qty 1000

## 2022-07-13 MED ORDER — IOHEXOL 350 MG/ML SOLN
100.0000 mL | Freq: Once | INTRAVENOUS | Status: AC | PRN
  Administered 2022-07-13: 100 mL via INTRAVENOUS

## 2022-07-13 MED ORDER — OXYCODONE HCL 5 MG PO TABS: 5.0000 mg | ORAL_TABLET | ORAL | Status: DC | PRN

## 2022-07-13 MED ORDER — HEPARIN SODIUM (PORCINE) 5000 UNIT/ML IJ SOLN
5000.0000 [IU] | Freq: Three times a day (TID) | INTRAMUSCULAR | Status: DC
  Administered 2022-07-13 – 2022-07-16 (×10): 5000 [IU] via SUBCUTANEOUS
  Filled 2022-07-13 (×10): qty 1

## 2022-07-13 MED ORDER — CHLORHEXIDINE GLUCONATE CLOTH 2 % EX PADS
6.0000 | MEDICATED_PAD | Freq: Every day | CUTANEOUS | Status: DC
  Administered 2022-07-14 – 2022-07-15 (×2): 6 via TOPICAL

## 2022-07-13 MED ORDER — PANTOPRAZOLE SODIUM 40 MG IV SOLR
40.0000 mg | Freq: Every day | INTRAVENOUS | Status: DC
  Administered 2022-07-13 – 2022-07-15 (×3): 40 mg via INTRAVENOUS
  Filled 2022-07-13 (×3): qty 10

## 2022-07-13 MED ORDER — FUROSEMIDE 10 MG/ML IJ SOLN
INTRAMUSCULAR | Status: AC
  Filled 2022-07-13: qty 4

## 2022-07-13 MED ORDER — ONDANSETRON HCL 4 MG/2ML IJ SOLN: 4.0000 mg | Freq: Four times a day (QID) | INTRAMUSCULAR | Status: DC | PRN

## 2022-07-13 MED ORDER — KETOROLAC TROMETHAMINE 15 MG/ML IJ SOLN: 15.0000 mg | Freq: Four times a day (QID) | INTRAMUSCULAR | Status: DC | PRN

## 2022-07-13 MED ORDER — IPRATROPIUM-ALBUTEROL 0.5-2.5 (3) MG/3ML IN SOLN
3.0000 mL | RESPIRATORY_TRACT | Status: DC
  Administered 2022-07-13 (×2): 3 mL via RESPIRATORY_TRACT
  Filled 2022-07-13: qty 3

## 2022-07-13 MED ORDER — MAGNESIUM SULFATE 2 GM/50ML IV SOLN
2.0000 g | Freq: Once | INTRAVENOUS | Status: AC
  Administered 2022-07-13: 2 g via INTRAVENOUS
  Filled 2022-07-13: qty 50

## 2022-07-13 MED ORDER — LACTATED RINGERS IV BOLUS: 1000.0000 mL | Freq: Once | INTRAVENOUS | Status: DC

## 2022-07-13 MED ORDER — ACETAMINOPHEN 500 MG PO TABS
1000.0000 mg | ORAL_TABLET | Freq: Four times a day (QID) | ORAL | Status: DC
  Administered 2022-07-13 – 2022-07-16 (×10): 1000 mg via ORAL
  Filled 2022-07-13 (×10): qty 2

## 2022-07-13 MED ORDER — VANCOMYCIN HCL 1250 MG/250ML IV SOLN
1250.0000 mg | Freq: Once | INTRAVENOUS | Status: AC
  Administered 2022-07-13: 1250 mg via INTRAVENOUS
  Filled 2022-07-13: qty 250

## 2022-07-13 MED ORDER — FUROSEMIDE 10 MG/ML IJ SOLN
20.0000 mg | Freq: Once | INTRAMUSCULAR | Status: AC
  Administered 2022-07-13: 20 mg via INTRAVENOUS

## 2022-07-13 MED ORDER — SODIUM BICARBONATE 8.4 % IV SOLN
100.0000 meq | Freq: Once | INTRAVENOUS | Status: AC
  Administered 2022-07-13: 100 meq via INTRAVENOUS
  Filled 2022-07-13: qty 100

## 2022-07-13 MED ORDER — IOHEXOL 300 MG/ML  SOLN
100.0000 mL | Freq: Once | INTRAMUSCULAR | Status: AC | PRN
  Administered 2022-07-13: 100 mL via INTRAVENOUS

## 2022-07-13 NOTE — Assessment & Plan Note (Signed)
Continue IV fluid resuscitation

## 2022-07-13 NOTE — ED Notes (Signed)
RN aware of assigned bed

## 2022-07-13 NOTE — Assessment & Plan Note (Signed)
Continue IV PPI. ?

## 2022-07-13 NOTE — Significant Event (Signed)
Rapid Response Event Note   Reason for Call : rr CALLED FOR HYPOXIA, AND TACHYCARDIA, OVERALL DECLINE.   Initial Focused Assessment: LAYING IN BED, SHAKING, AWAKE, ANSWERS QUESTIONS, SEE FLOWSHEETS FOR VS.      Interventions: ABG, BMP, LACTIC, NEB TX, COOLING BLANKET, CXR, CT STUDIES ORDERED, WILL TRANSFER TO STEPDOWN.   Plan of Care: AS ABOVE    Event Summary: AS ABOVE  MD Notified: [id number] Call Delmar A, RN

## 2022-07-13 NOTE — Progress Notes (Addendum)
Rapid response called at about 1:47 PM because of worsening hypoxia and tachycardia. Per RN patient's pulse oximetry was 88 on 6 L with a heart rate of 113 Patient was seen and examined at bedside.  Family was at the bedside.  Appeared to be in distress with shaking chills and rigors.  Nonrebreather mask in place.  Had audible wheezes.  Markedly elevated blood pressure with systolic blood pressure in the 170s Patient admitted to the hospital for sepsis from possible intra-abdominal source Per patient's family he was recently started on Keflex for UTI and has had diarrhea for several days.  Wife states he has been sick for about 5 days and has had fever and chills at home.  Had a home COVID test which was negative ??  Colitis Repeat vital signs showed temp of 103 General exam-elderly male who appears to be in distress with shaking chills and rigors Lungs -expiratory wheezes in all lung fields Abdomen -bowel sounds present, soft, tender in the right lower quadrant with guarding Extremities no pedal edema  Gave patient a dose of Lasix 20 mg IV push due to concerns of possible fluid overload Patient received breathing treatment Arterial blood gas done WNL Chest x-ray reviewed by me clear with no evidence of infiltrate or pleural effusion Patient already received acetaminophen and will be placed on cooling blanket Patient weaned down to 2 L of oxygen  Impression severe sepsis from possible intra-abdominal source ??  Acute colitis  Follow-up results of blood cultures Continue IV fluid resuscitation  Discussed patient's condition and plan of care with his wife and sons at the bedside.  All questions and concerns have been addressed.  They verbalized understanding and agree with the plan.

## 2022-07-13 NOTE — H&P (Signed)
Patient ID: Chad Park., male   DOB: 04/27/1941, 82 y.o.   MRN: [id number]  HPI Chad Park. is a 82 y.o. male seen in consultation by Dr. Tamala Julian.  I had a lengthy discussion with him regarding the case.  He presented to the emergency room and was brought by EMS.  An issue was abdominal pain, diarrhea weakness.  He is on antibiotic exposure to a presumed UTI.  Currently repetitive falls.  Does have a history of hypertension, coronary artery disease. Today he reports some abdominal pain diffusely on the right upper quadrant and epigastric area.  Today he tells me that his main concern was his diarrhea. Initially in the ER he was appropriately resuscitated and also developed some wheezing.  He has been febrile for the last 24 hours.  Appropriate antibiotics have been started. Did have a CT scan of the abdomen pelvis that I have personally reviewed showing just very questionable some mucosal enhancement of the appendix.  These are nonspecific symptoms are not clear-cut evidence for appendicitis.  There was no free air no abscess. Urinalysis was clear.  He also had extensive workup including a negative CT of the head as well as negative RSV and COVID workup. He has a cough that is occasionally productive of phlegm but is unable to tell me the color of the phlegm.  Labs reviewed.  Lactate 2.5 >> 1.9, procalcitonin 1.23, PT 15.5, INR 1.2, sodium 135, potassium 3.0, chloride 103, bicarb 19, glucose 132, BUN 22, creatinine 1.31, calcium 8.7, total protein 7.7, albumin 3.5, AST 66, ALT 51, alk phos 65, total bilirubin 1.3, white count 7.8, hemoglobin 14.6, hematocrit 43.5, platelet count 184   HPI  Past Medical History:  Diagnosis Date   Abdominal pain, acute, left lower quadrant 07/23/2018   Cataract Left Eye   Colon polyps    Coronary artery disease    Dysphagia    Encounter for colonoscopy due to history of adenomatous colonic polyps 07/26/2018   GERD (gastroesophageal reflux disease)    Gout     Heart murmur    Hiatal hernia    History of kidney stones    Hyperlipidemia    Hypertension    Kidney stones    Peyronie's disease    Wears dentures    full upper and lower    Past Surgical History:  Procedure Laterality Date   CATARACT EXTRACTION W/PHACO Left 12/05/2021   Procedure: [place] OF LENS LEFT ;  Surgeon: Leandrew Koyanagi, MD;  Location: [place];  Service: Ophthalmology;  Laterality: Left;   COLONOSCOPY WITH PROPOFOL N/A 03/22/2015   Procedure: COLONOSCOPY WITH PROPOFOL;  Surgeon: Manya Silvas, MD;  Location: [facility] ENDOSCOPY;  Service: Endoscopy;  Laterality: N/A;   COLONOSCOPY WITH PROPOFOL N/A 09/07/2018   Procedure: COLONOSCOPY WITH PROPOFOL;  Surgeon: Manya Silvas, MD;  Location: [facility] ENDOSCOPY;  Service: Endoscopy;  Laterality: N/A;   COLONOSCOPY, ESOPHAGOGASTRODUODENOSCOPY (EGD) AND ESOPHAGEAL DILATION     ESOPHAGOGASTRODUODENOSCOPY (EGD) WITH PROPOFOL N/A 03/22/2015   Procedure: ESOPHAGOGASTRODUODENOSCOPY (EGD) WITH PROPOFOL;  Surgeon: Manya Silvas, MD;  Location: [facility] ENDOSCOPY;  Service: Endoscopy;  Laterality: N/A;   ESOPHAGOGASTRODUODENOSCOPY (EGD) WITH PROPOFOL N/A 09/07/2018   Procedure: ESOPHAGOGASTRODUODENOSCOPY (EGD) WITH PROPOFOL;  Surgeon: Manya Silvas, MD;  Location[facility]OSCOPY;  Service: Endoscopy;  Laterality: N/A;   ESOPHAGOGASTRODUODENOSCOPY (EGD) WITH PROPOFOL N/A 10/18/2020   Procedure: ESOPHAGOGASTRODUODENOSCOPY (EGD) WITH PROPOFOL;  Surgeon: Toledo, Benay Pike, MD;  Location: [facility] ENDOSCOPY;  Service: Gastroenterology;  Laterality: N/A;  LEFT HEART CATH AND CORONARY ANGIOGRAPHY Left 11/27/2016   Procedure: Left Heart Cath and Coronary Angiography;  Surgeon: Corey Skains, MD;  Location: [place] CV LAB;  Service: Cardiovascular;  Laterality: Left;   SAVORY DILATION N/A 09/07/2018   Procedure: SAVORY DILATION;  Surgeon: Manya Silvas, MD;  Location: [facility] ENDOSCOPY;  Service: Endoscopy;  Laterality: N/A;     Family History  Problem Relation Age of Onset   Pulmonary embolism Mother    Lung cancer Father    Lung cancer Brother     Social History Social History   Tobacco Use   Smoking status: Former    Years: 20.00    Types: Cigarettes   Smokeless tobacco: Current    Types: Chew   Tobacco comments:    quit 30 years ago   Vaping Use   Vaping Use: Never used  Substance Use Topics   Alcohol use: Yes    Alcohol/week: 2.0 - 3.0 standard drinks of alcohol    Types: 2 - 3 Cans of beer per week   Drug use: No    Allergies  Allergen Reactions   Zocor [Simvastatin] Other (See Comments)    headache   Doxycycline Rash    Rash and burning of hands    Current Facility-Administered Medications  Medication Dose Route Frequency Provider Last Rate Last Admin   acetaminophen (TYLENOL) tablet 1,000 mg  1,000 mg Oral Q6H Ilham Roughton F, MD   1,000 mg at 07/13/22 0518   heparin injection 5,000 Units  5,000 Units Subcutaneous Q8H Darlene Brozowski F, MD   5,000 Units at 07/13/22 0517   ketorolac (TORADOL) 15 MG/ML injection 15 mg  15 mg Intravenous Q6H PRN Doreatha Offer F, MD       lactated ringers 1,000 mL with potassium chloride 40 mEq infusion   Intravenous Continuous Agbata, Tochukwu, MD       lactated ringers bolus 1,000 mL  1,000 mL Intravenous Once Avannah Decker, Marjory Lies, MD   Stopped at 07/13/22 0430   morphine (PF) 2 MG/ML injection 2 mg  2 mg Intravenous Q4H PRN Suri Tafolla F, MD       ondansetron (ZOFRAN-ODT) disintegrating tablet 4 mg  4 mg Oral Q6H PRN Salvatore Poe F, MD       Or   ondansetron (ZOFRAN) injection 4 mg  4 mg Intravenous Q6H PRN Mort Smelser F, MD       oxyCODONE (Oxy IR/ROXICODONE) immediate release tablet 5-10 mg  5-10 mg Oral Q4H PRN Kadey Mihalic F, MD       pantoprazole (PROTONIX) injection 40 mg  40 mg Intravenous QHS Alfretta Pinch F, MD       piperacillin-tazobactam (ZOSYN) IVPB 3.375 g  3.375 g Intravenous Q8H Keyly Baldonado F, MD 12.5 mL/hr at 07/13/22 0943 3.375 g at  07/13/22 0943     Review of Systems Full ROS  was asked and was negative except for the information on the HPI  Physical Exam Blood pressure 101/68, pulse 84, temperature 98.2 F (36.8 C), resp. rate 17, height '5\' 4"'$  (1.626 m), weight 71.7 kg, SpO2 98 %. CONSTITUTIONAL: NAD seems chronically ill. EYES: Pupils are equal, round,  Sclera are non-icteric. EARS, NOSE, MOUTH AND THROAT: The oropharynx is clear. The oral mucosa is pink and moist. Hearing is intact to voice. LYMPH NODES:  Lymph nodes in the neck are normal. RESPIRATORY:  Lungs are clear. There is normal respiratory effort, with bilateral wheezes CARDIOVASCULAR: Heart is regular without murmurs, gallops, or  rubs. GI: The abdomen is  soft, Mild tenderness  There are no palpable masses. There is no hepatosplenomegaly. There are normal bowel sounds in all quadrants. GU: Rectal deferred.   MUSCULOSKELETAL: Normal muscle strength and tone. No cyanosis or edema.   SKIN: Turgor is good and there are no pathologic skin lesions or ulcers. NEUROLOGIC: Motor and sensation is grossly normal. Cranial nerves are grossly intact. PSYCH:  Oriented to person, place and time. Affect is normal.  Data Reviewed  I have personally reviewed the patient's imaging, laboratory findings and medical records.    Assessment/Plan 82 year old male with clinical picture consistent with sepsis and likely colitis.  Concerned about C. difficile colitis or any other form of infectious colitis.  I do think that findings of early appendicitis are incidental.  His clinical picture is not consistent with appendicitis. Moreover he seems to have some pulmonary issues that are chronic and have not been resolved.  I do think that he is best managed by the medical team.  At this time no need for surgical intervention.  Will continue serial abdominal exams.  He is not peritonitic we may need to do a repeat CT scan of the abdomen. I agree with broad spectrum antibiotic  continuation given his septic picture.  Wondering if urologic orders might be within the differentials. Please note that I have spent 75 minutes in this encounter including personally reviewing imaging studies, coordinating his care, placing orders, counseling the patient and performing appropriate documentation     Caroleen Hamman, MD FACS General Surgeon 07/13/2022, 9:44 AM

## 2022-07-13 NOTE — ED Notes (Signed)
ED Provider at bedside. 

## 2022-07-13 NOTE — ED Notes (Signed)
Dr. Dahlia Byes called back and asked this RN to ask Dr. Tamala Julian to visually evaluate the pt. Dr. Tamala Julian and this RN went into the room and pt was evaluated. Pt's temperature had increased since this RN last took it. Orders placed. Oxygen placed on pt for comfort.

## 2022-07-13 NOTE — ED Notes (Addendum)
This RN went to administer pt's morning meds and found pt to be labored breathing with audible wheezing, pt was not breathing like this when this RN was in the room at 0501. Dr. Dahlia Byes paged.

## 2022-07-13 NOTE — ED Notes (Signed)
Pt's breathing is less labored at this time and he is more comfortable appearing.

## 2022-07-13 NOTE — Consult Note (Addendum)
Initial Consultation Note   Patient: Chad Park. WFU:[id number] DOB: [birth year] PCP: [place] Pink, MD DOA: 07/12/2022 DOS: the patient was seen and examined on 07/13/2022 Primary service: Jules Husbands, MD  Referring physician: Dr Dahlia Byes Reason for consult: Concern for other sources sepsis  Assessment/Plan: Sepsis [facility]) Present on admission and as evidenced by fever with a Tmax of 103.1, tachycardia, tachypnea, normal white cell count with a left shift, lactic acidosis and a possible intra-abdominal source CT scan of abdomen and pelvis is concerning for possible early appendicitis No other source of sepsis noted at this time Today aggressive IV fluid resuscitation Follow-up results of blood cultures Further treatment plan per surgery   Hiatal hernia with GERD Continue IV PPI  Hypokalemia Secondary to GI losses from poor oral intake, nausea and vomiting Supplement potassium Check magnesium levels  Falls Most likely related to hypotension We will place him on fall precautions PT evaluate and treat once appropriate  Dehydration Continue IV fluid resuscitation       TRH will continue to follow the patient.  HPI: Chad Park. is a 82 y.o. male with past medical history significant for GERD with hiatal hernia, CAD, hypertension, GOUT who presents to the emergency room for evaluation of weakness and a fall. Patient states that he has felt unwell for last 2 weeks. He complains of nausea, poor oral intake, weakness and several falls. Per chart review he has not felt well for over a week and a urine sample was sent to his primary care provider and patient was started on antibiotics for possible UTI.  Oral intake has been very poor due to nausea and last bowel movement was 1 day prior to his admission.  He complains of feeling dizzy especially with ambulation.  He has had some abdominal discomfort but is vague about his symptoms.  He has a cough that is occasionally  productive of phlegm but is unable to tell me the color of the phlegm. He denies having any chest pain, no leg swelling, no headache, no blurred vision no focal deficit Vital signs upon arrival to the ER Tmax 103.1, respiratory rate 24, pulse rate 125, blood pressure 95/68 Abnormal labs include lactate of 2.5 which shows a downward trend, potassium 3.0, bicarb 19, serum creatinine 1.3, AST 66, ALT 51 CT scan of abdomen and pelvis shows slight increased enhancement of the appendiceal mucosa without other CT findings of acute appendicitis. An early acute appendicitis is less likely but not excluded clinical correlation is recommended. Sigmoid diverticulosis without active inflammatory changes. No bowel obstruction. Cholelithiasis. Heterogeneity of the prostate gland. Clinical correlation is recommended to evaluate for possibility of prostatitis. A 3 mm nodule along the minor fissure. No follow-up needed if patient is low-risk. Patient received 2 L IV fluid bolus in the ER and has been started on Zosyn He is currently admitted to the surgical service and medical consult was requested to rule out other sources of sepsis at this time    Review of Systems: As mentioned in the history of present illness. All other systems reviewed and are negative. Past Medical History:  Diagnosis Date   Abdominal pain, acute, left lower quadrant 07/23/2018   Cataract Left Eye   Colon polyps    Coronary artery disease    Dysphagia    Encounter for colonoscopy due to history of adenomatous colonic polyps 07/26/2018   GERD (gastroesophageal reflux disease)    Gout    Heart murmur    Hiatal hernia  History of kidney stones    Hyperlipidemia    Hypertension    Kidney stones    Peyronie's disease    Wears dentures    full upper and lower   Past Surgical History:  Procedure Laterality Date   CATARACT EXTRACTION W/PHACO Left 12/05/2021   Procedure: [place] OF LENS LEFT ;  Surgeon: Leandrew Koyanagi, MD;   Location: [place];  Service: Ophthalmology;  Laterality: Left;   COLONOSCOPY WITH PROPOFOL N/A 03/22/2015   Procedure: COLONOSCOPY WITH PROPOFOL;  Surgeon: Manya Silvas, MD;  Location: [facility] ENDOSCOPY;  Service: Endoscopy;  Laterality: N/A;   COLONOSCOPY WITH PROPOFOL N/A 09/07/2018   Procedure: COLONOSCOPY WITH PROPOFOL;  Surgeon: Manya Silvas, MD;  Location: [facility] ENDOSCOPY;  Service: Endoscopy;  Laterality: N/A;   COLONOSCOPY, ESOPHAGOGASTRODUODENOSCOPY (EGD) AND ESOPHAGEAL DILATION     ESOPHAGOGASTRODUODENOSCOPY (EGD) WITH PROPOFOL N/A 03/22/2015   Procedure: ESOPHAGOGASTRODUODENOSCOPY (EGD) WITH PROPOFOL;  Surgeon: Manya Silvas, MD;  Location: [facility] ENDOSCOPY;  Service: Endoscopy;  Laterality: N/A;   ESOPHAGOGASTRODUODENOSCOPY (EGD) WITH PROPOFOL N/A 09/07/2018   Procedure: ESOPHAGOGASTRODUODENOSCOPY (EGD) WITH PROPOFOL;  Surgeon: Manya Silvas, MD;  Location: [facility] ENDOSCOPY;  Service: Endoscopy;  Laterality: N/A;   ESOPHAGOGASTRODUODENOSCOPY (EGD) WITH PROPOFOL N/A 10/18/2020   Procedure: ESOPHAGOGASTRODUODENOSCOPY (EGD) WITH PROPOFOL;  Surgeon: Toledo, Benay Pike, MD;  Location: [facility] ENDOSCOPY;  Service: Gastroenterology;  Laterality: N/A;   LEFT HEART CATH AND CORONARY ANGIOGRAPHY Left 11/27/2016   Procedure: Left Heart Cath and Coronary Angiography;  Surgeon: Corey Skains, MD;  Location: [place] CV LAB;  Service: Cardiovascular;  Laterality: Left;   SAVORY DILATION N/A 09/07/2018   Procedure: SAVORY DILATION;  Surgeon: Manya Silvas, MD;  Location: [facility] ENDOSCOPY;  Service: Endoscopy;  Laterality: N/A;   Social History:  reports that he has quit smoking. His smoking use included cigarettes. His smokeless tobacco use includes chew. He reports current alcohol use of about 2.0 - 3.0 standard drinks of alcohol per week. He reports that he does not use drugs.  Allergies  Allergen Reactions   Zocor [Simvastatin] Other (See Comments)    headache   Doxycycline  Rash    Rash and burning of hands    Family History  Problem Relation Age of Onset   Pulmonary embolism Mother    Lung cancer Father    Lung cancer Brother     Prior to Admission medications   Medication Sig Start Date End Date Taking? Authorizing Provider  amLODipine (NORVASC) 5 MG tablet Take 5 mg by mouth daily.   Yes [provider]  brimonidine-timolol (COMBIGAN) 0.2-0.5 % ophthalmic solution Place 1 drop into the left eye every 12 (twelve) hours. 02/18/22  Yes [provider]  dorzolamide (TRUSOPT) 2 % ophthalmic solution Place 1 drop into the left eye 2 (two) times daily. 12/14/21  Yes [provider]  omeprazole (PRILOSEC) 20 MG capsule Take 20 mg by mouth daily.   Yes [provider]  pravastatin (PRAVACHOL) 80 MG tablet Take 80 mg by mouth at bedtime. 04/25/22  Yes [provider]  telmisartan (MICARDIS) 80 MG tablet Take 80 mg by mouth daily.   Yes [provider]  allopurinol (ZYLOPRIM) 300 MG tablet Take 300 mg by mouth daily. Patient not taking: Reported on 12/03/2021    [provider]  aspirin 81 MG tablet Take 81 mg by mouth daily.    [provider]  Cholecalciferol (VITAMIN D) 2000 units CAPS Take 2,000 Units by mouth daily.  [provider]  Cyanocobalamin (B-12) 3000 MCG CAPS Take 3,000 mcg by mouth daily.    [provider]  ibuprofen (ADVIL) 200 MG tablet Take 200 mg by mouth every 6 (six) hours as needed.    [provider]  triamcinolone ointment (KENALOG) 0.5 % Apply 1 application topically daily as needed (rash and itching).  12/27/15   [provider]    Physical Exam: Vitals:   07/13/22 0700 07/13/22 0739 07/13/22 0800 07/13/22 0913  BP: 93/61  103/62 101/68  Pulse: (!) 106 91 87 84  Resp: (!) 24 15 (!) 21 17  Temp:   98.4 F (36.9 C) 98.2 F (36.8 C)  TempSrc:   Oral   SpO2: 96% 96% 98% 98%  Weight:      Height:       Physical Exam Vitals and  nursing note reviewed.  Constitutional:      Comments: Acutely ill-appearing  HENT:     Head: Normocephalic and atraumatic.     Mouth/Throat:     Comments: Very dry mucous membrane Cardiovascular:     Rate and Rhythm: Tachycardia present.     Heart sounds: Normal heart sounds.  Pulmonary:     Effort: Pulmonary effort is normal.     Breath sounds: Normal breath sounds.  Abdominal:     General: Bowel sounds are normal.     Tenderness: There is abdominal tenderness in the right lower quadrant.     Comments: Soft, guarding in the right lower quadrant and periumbilical area  Skin:    General: Skin is warm and dry.  Neurological:     General: No focal deficit present.     Mental Status: He is alert.  Psychiatric:        Mood and Affect: Mood normal.        Behavior: Behavior normal.     Data Reviewed:  Relevant notes from primary care and specialist visits, past discharge summaries as available in EHR, including Care Everywhere. Prior diagnostic testing as pertinent to current admission diagnoses Updated medications and problem lists for reconciliation ED course, including vitals, labs, imaging, treatment and response to treatment Triage notes, nursing and pharmacy notes and ED provider's notes Notable results as noted in HPI Labs reviewed.  Lactate 2.5 >> 1.9, procalcitonin 1.23, PT 15.5, INR 1.2, sodium 135, potassium 3.0, chloride 103, bicarb 19, glucose 132, BUN 22, creatinine 1.31, calcium 8.7, total protein 7.7, albumin 3.5, AST 66, ALT 51, alk phos 65, total bilirubin 1.3, white count 7.8, hemoglobin 14.6, hematocrit 43.5, platelet count 184 Chest x-ray reviewed by me shows No active cardiopulmonary disease.  CT scan of the head without contrast shows no acute intracranial hemorrhage or infarct.Mild senescent change. Twelve-lead EKG reviewed by me shows sinus tachycardia.  Nonspecific ST changes There are no new results to review at this time.    Family Communication:  Greater than 50% of time was spent discussing patient's condition and plan of care with him at the bedside.  All questions and concerns have been addressed.  He verbalizes understanding and agrees with the plan. Primary team communication:  Thank you very much for involving [place] in the care of your patient.  Author: Collier Bullock, MD 07/13/2022 10:03 AM  For on call review [email or web address].

## 2022-07-13 NOTE — ED Notes (Signed)
Around 5:30 AM after this patient has been admitted to Dr. Dahlia Byes, bedside nurse grabs me and asked me to reevaluate the patient at his request.  Bedside nurse had found the patient tachypneic, audible breathing and seem to be a clinical change from her previous assessment.  I immediately reevaluated the patient and is noted to be warm to me so we check an oral temperature which is elevated at 101.4 F.  He is more tachycardic and tachypneic than last time I reassessed him prior to admission.  Will provide antipyretics.  Also get a repeat CXR which is unchanged.  I reassess a few minutes later and he seems to improved.  I suspect symptomatic fever.  Remains admitted to surgical service.  At the request of Dr. Dahlia Byes, I consult with hospitalist to provide further input regarding possible etiologies of sepsis.  I consult with Dr. Georga Bora, Camillia Herter, MD 07/13/22 [phone]

## 2022-07-13 NOTE — Assessment & Plan Note (Addendum)
Secondary to GI losses from poor oral intake, nausea and vomiting Supplement potassium and magnesium stable

## 2022-07-13 NOTE — Assessment & Plan Note (Addendum)
Initially presented with signs consistent with severe sepsis given persistent hypotension, fever, tachycardia and tachypnea and following admission, decompensated noting lactic acid level of 8 and worsening infection.  Suspect source is from retained stool causing bacterial translocation of the gut given abdominal tenderness.  (See below) repeat CT unremarkable and noted no signs of questionable appendiceal involvement that was seen on for CT.  Markedly elevated procalcitonin of 19 has improved and continued to improve decreasing by half each day following fluid resuscitation and antibiotics.  Lactic acid level normalized.    No evidence of other source of infection.  Labs reviewed from outpatient and patient had a pansensitive E. coli UTI that was successfully treated with adequate Ancef.  No evidence of pneumonia.  Exam reveals no evidence of cellulitis

## 2022-07-13 NOTE — Progress Notes (Signed)
Discussed with surgeon who is not convinced that patient has acute appendicitis and has requested transfer to medical service. Patient will be transferred to the medical service with surgical consult

## 2022-07-13 NOTE — Progress Notes (Signed)
Chaplain responded to RR page, encountered spouse and two sons bedside. Provided introduction and initial emotional support.  Per RN, escorted family to waiting area.  Wife Carlene and pt have been married 39 years.  Family was distressed at being removed from room. Chaplain provided orientation and emotional support.  Facilitated communication with MD, and returned to check on family.    Please call as needed for on-going support.  Minus [place], [place] Pager:  [phone]     07/13/22 1500  Spiritual Encounters  Type of Visit Initial  Care provided to: Family  Referral source  (RR page)  OnCall Visit Yes  Spiritual Framework  Presenting Themes Impactful experiences and emotions

## 2022-07-13 NOTE — Assessment & Plan Note (Addendum)
Likely related to hypotension and early infection.  PT to see.

## 2022-07-13 NOTE — Consult Note (Signed)
NAME:  Chad Canino., MRN:  [id number], DOB:  [birth year], LOS: 0 ADMISSION DATE:  07/12/2022, CONSULTATION DATE: 07/13/22 REFERRING MD: Dr. Francine Graven, CHIEF COMPLAINT: Sepsis   History of Present Illness:  This is a 82 yo male who presented to [facility] ER on 01/19 following a fall and difficulty with ambulation.  He also endorsed weakness, abdominal pain, and urinary frequency. Per ER notes family reported the pt has had worsening weakness and poor po intake onset 01/16.  He was diagnosed with a UTI per UA on 01/17 and prescribed keflex.    ED Course Upon arrival to the ER significant vital signs were: temp 103.1 and hr 124.  UA with trace leukocytes.  COVID-19/Influenza A&B/RSV negative.  CT Abd Pelvis slight increased enhancement of the appendiceal mucosa could not exclude early acute appendicitis, therefore General Surgery consulted by ER provider.  Pt admitted to the [facility] unit per General Surgery.  See detailed hospital course below under significant events.    CT Abd/Pelvis:  Slight increased enhancement of the appendiceal mucosa without other CT findings of acute appendicitis. An early acute appendicitis is less likely but not excluded clinical correlation is recommended. Sigmoid diverticulosis without active inflammatory changes. No bowel obstruction. Cholelithiasis. Heterogeneity of the prostate gland. Clinical correlation is recommended to evaluate for possibility of prostatitis. A 3 mm nodule along the minor fissure. No follow-up needed if patient is low-risk.This recommendation follows the consensus statement: Guidelines for Management of Incidental Pulmonary Nodule Detected on CT Images: From the Fleischner Society 2017; Radiology 2017; 284:228-243. Aortic Atherosclerosis (ICD10-I70.0).  CT Head: No acute intracranial hemorrhage or infarct. Mild senescent change  Pertinent  Medical History  Colon Polyps  Dysphagia CAD GERD Gout Heart Murmur Hiatal Hernia  HLD Kidney  Stones HLD HTN Peyronie's Disease   Significant Hospital Events: Including procedures, antibiotic start and stop dates in addition to other pertinent events   01/19: Pt admitted with sepsis without shock  01/20: Rapid response initiated pt febrile with rigors, hypoxic, and tachycardic.  Received 20 mg iv lasix x1 dose.  PCCM team consulted to determine source of infection  01/20: CTA Abd Pelvis>> Normal contour and caliber of the abdominal aorta.        No evidence of aneurysm, dissection, or other acute aortic pathology. Mild mixed       calcific atherosclerosis. Prominent impression of the diaphragmatic crus on the        origin of the celiac axis, which is narrowed greater than 50%, however remains       patent. This is potentially symptomatic; correlate for clinical signs and symptoms        of median arcuate ligament syndrome. The remaining aortic branch vessels are        patent. No evidence of end-organ septic emboli. Punctuate nonobstructive left        nephrolithiasis. No right-sided calculi, ureteral calculi, or hydronephrosis.       Cholelithiasis without evidence of acute cholecystitis. Descending and sigmoid        diverticulosis without evidence of acute diverticulitis. Aortic Atherosclerosis        (ICD10-I70.0).   Interim History / Subjective:  Pt fever has improved temp 99.6 F.  No signs of respiratory distress remains on 2L O2 via nasal canula   Objective   Blood pressure (!) 157/66, pulse (!) 138, temperature (!) 103.3 F (39.6 C), temperature source Rectal, resp. rate (!) 25, height '5\' 4"'$  (1.626 m), weight 71.7 kg, SpO2 92 %.  Intake/Output Summary (Last 24 hours) at 07/13/2022 1503 Last data filed at 07/13/2022 0430 Gross per 24 hour  Intake 3050 ml  Output --  Net 3050 ml   Filed Weights   07/12/22 2326  Weight: 71.7 kg    Examination: General: Acutely-ill appearing male resting in bed, NAD on 2L O2 via nasal canula  HENT: Supple, no JVD  Lungs:  Clear throughout, even, non labored  Cardiovascular: NSR, s1s2, rrr, no r/g, 2+ radial/2+ distal pulses, no edema  Abdomen: Hypoactive BS x4, taut, distended, RUQ tenderness  Extremities: Normal bulk and tone, moves all extremities  Neuro: Alert and oriented, following commands, PERRLA  GU: Deferred   Resolved Hospital Problem list     Assessment & Plan:  Sepsis without shock secondary to Ecoli UTI and possible Cdiff colitis  Fevers     CTA Abd Pelvis 07/13/22>>Normal contour and caliber of the abdominal         aorta. No evidence of aneurysm, dissection, or other acute aortic pathology.     No evidence of end-organ septic emboli.  Cholelithiasis without evidence of    acute cholecystitis.  Descending and sigmoid diverticulosis without evidence of    acute diverticulitis. - Trend WBC and monitor fever curve  - Trend PCT  - Follow cultures  - Continue zosyn outpatient urine culture positive for Ecoli sensitive to zosyn  - GI panel/Cdiff pending; If Cdiff positive will start oral vancomycin  - Scheduled tylenol and toradol and prn cooling blanket for fevers   Acute kidney injury with compensated metabolic acidosis likely secondary to volume depletion  Hypokalemia and hypomagnesia  Lactic acidosis  - Trend BMP and lactic acid  - Continue iv fluid resuscitation  - Replace electrolytes as indicated  - Monitor UOP - Avoid nephrotoxic medications as able   Acute respiratory failure secondary to metabolic acidosis  - Supplemental O2 for dyspnea and/or hypoxia - Will give 2 amps of sodium bicarb  - Scheduled duonebs for now  - Respiratory (~20 pathogens) results pending   Acute pain  - Prn oxycodone and morphine for pain management   Best Practice (right click and "Reselect all SmartList Selections" daily)   Diet/type: clear liquids DVT prophylaxis: prophylactic heparin  GI prophylaxis: PPI Lines: N/A Foley:  N/A Code Status:  full code Last date of multidisciplinary goals  of care discussion [N/A]  01/20: Updated pt, pts wife, and son at bedside regarding current plan of care all questions were answered  Labs   CBC: Recent Labs  Lab 07/12/22 2332  WBC 7.8  NEUTROABS 7.0  HGB 14.6  HCT 43.5  MCV 92.2  PLT 962    Basic Metabolic Panel: Recent Labs  Lab 07/12/22 2332 07/13/22 1000 07/13/22 1002  NA 135 136  --   K 3.0* 3.3*  --   CL 103 105  --   CO2 19* 21*  --   GLUCOSE 132* 107*  --   BUN 22 17  --   CREATININE 1.31* 1.36*  --   CALCIUM 8.7* 7.9*  --   MG  --   --  1.8   GFR: Estimated Creatinine Clearance: 40 mL/min (A) (by C-G formula based on SCr of 1.36 mg/dL (H)). Recent Labs  Lab 07/12/22 2332 07/13/22 0143 07/13/22 1359  PROCALCITON 1.23  --   --   WBC 7.8  --   --   LATICACIDVEN 2.5* 1.9 8.4*    Liver Function Tests: Recent Labs  Lab 07/12/22 2332  AST  66*  ALT 51*  ALKPHOS 65  BILITOT 1.3*  PROT 7.7  ALBUMIN 3.5   No results for input(s): "LIPASE", "AMYLASE" in the last 168 hours. No results for input(s): "AMMONIA" in the last 168 hours.  ABG    Component Value Date/Time   PHART 7.43 07/13/2022 1405   PCO2ART 26 (L) 07/13/2022 1405   PO2ART 133 (H) 07/13/2022 1405   HCO3 17.3 (L) 07/13/2022 1405   ACIDBASEDEF 5.4 (H) 07/13/2022 1405   O2SAT 99.6 07/13/2022 1405     Coagulation Profile: Recent Labs  Lab 07/12/22 2332  INR 1.2    Cardiac Enzymes: No results for input(s): "CKTOTAL", "CKMB", "CKMBINDEX", "TROPONINI" in the last 168 hours.  HbA1C: No results found for: "HGBA1C"  CBG: Recent Labs  Lab 07/13/22 1351  GLUCAP 82    Review of Systems: Positives in BOLD   Gen: Denies fever, chills, weight change, fatigue, night sweats HEENT: Denies blurred vision, double vision, hearing loss, tinnitus, sinus congestion, rhinorrhea, sore throat, neck stiffness, dysphagia PULM: Denies shortness of breath, cough, sputum production, hemoptysis, wheezing CV: Denies chest pain, edema, orthopnea,  paroxysmal nocturnal dyspnea, palpitations GI: abdominal pain, nausea, vomiting, diarrhea, hematochezia, melena, constipation, change in bowel habits GU: Denies dysuria, hematuria, polyuria, oliguria, urethral discharge Endocrine: Denies hot or cold intolerance, polyuria, polyphagia or appetite change Derm: Denies rash, dry skin, scaling or peeling skin change Heme: Denies easy bruising, bleeding, bleeding gums Neuro: Denies headache, numbness, weakness, slurred speech, loss of memory or consciousness  Past Medical History:  He,  has a past medical history of Abdominal pain, acute, left lower quadrant (07/23/2018), Cataract (Left Eye), Colon polyps, Coronary artery disease, Dysphagia, Encounter for colonoscopy due to history of adenomatous colonic polyps (07/26/2018), GERD (gastroesophageal reflux disease), Gout, Heart murmur, Hiatal hernia, History of kidney stones, Hyperlipidemia, Hypertension, Kidney stones, Peyronie's disease, and Wears dentures.   Surgical History:   Past Surgical History:  Procedure Laterality Date   CATARACT EXTRACTION W/PHACO Left 12/05/2021   Procedure: [place] OF LENS LEFT ;  Surgeon: Leandrew Koyanagi, MD;  Location: [place];  Service: Ophthalmology;  Laterality: Left;   COLONOSCOPY WITH PROPOFOL N/A 03/22/2015   Procedure: COLONOSCOPY WITH PROPOFOL;  Surgeon: Manya Silvas, MD;  Location: [facility] ENDOSCOPY;  Service: Endoscopy;  Laterality: N/A;   COLONOSCOPY WITH PROPOFOL N/A 09/07/2018   Procedure: COLONOSCOPY WITH PROPOFOL;  Surgeon: Manya Silvas, MD;  Location: [facility] ENDOSCOPY;  Service: Endoscopy;  Laterality: N/A;   COLONOSCOPY, ESOPHAGOGASTRODUODENOSCOPY (EGD) AND ESOPHAGEAL DILATION     ESOPHAGOGASTRODUODENOSCOPY (EGD) WITH PROPOFOL N/A 03/22/2015   Procedure: ESOPHAGOGASTRODUODENOSCOPY (EGD) WITH PROPOFOL;  Surgeon: Manya Silvas, MD;  Location: [facility] ENDOSCOPY;  Service: Endoscopy;  Laterality: N/A;   ESOPHAGOGASTRODUODENOSCOPY (EGD)  WITH PROPOFOL N/A 09/07/2018   Procedure: ESOPHAGOGASTRODUODENOSCOPY (EGD) WITH PROPOFOL;  Surgeon: Manya Silvas, MD;  Location: [facility] ENDOSCOPY;  Service: Endoscopy;  Laterality: N/A;   ESOPHAGOGASTRODUODENOSCOPY (EGD) WITH PROPOFOL N/A 10/18/2020   Procedure: ESOPHAGOGASTRODUODENOSCOPY (EGD) WITH PROPOFOL;  Surgeon: [place], Benay Pike, MD;  Location: [facility] ENDOSCOPY;  Service: Gastroenterology;  Laterality: N/A;   LEFT HEART CATH AND CORONARY ANGIOGRAPHY Left 11/27/2016   Procedure: Left Heart Cath and Coronary Angiography;  Surgeon: Corey Skains, MD;  Location: [place] CV LAB;  Service: Cardiovascular;  Laterality: Left;   SAVORY DILATION N/A 09/07/2018   Procedure: SAVORY DILATION;  Surgeon: Manya Silvas, MD;  Location: [facility] ENDOSCOPY;  Service: Endoscopy;  Laterality: N/A;     Social History:   reports that he  has quit smoking. His smoking use included cigarettes. His smokeless tobacco use includes chew. He reports current alcohol use of about 2.0 - 3.0 standard drinks of alcohol per week. He reports that he does not use drugs.   Family History:  His family history includes Lung cancer in his brother and father; Pulmonary embolism in his mother.   Allergies Allergies  Allergen Reactions   Zocor [Simvastatin] Other (See Comments)    headache   Doxycycline Rash    Rash and burning of hands     Home Medications  Prior to Admission medications   Medication Sig Start Date End Date Taking? Authorizing Provider  amLODipine (NORVASC) 5 MG tablet Take 5 mg by mouth daily.   Yes [provider]  brimonidine-timolol (COMBIGAN) 0.2-0.5 % ophthalmic solution Place 1 drop into the left eye every 12 (twelve) hours. 02/18/22  Yes [provider]  dorzolamide (TRUSOPT) 2 % ophthalmic solution Place 1 drop into the left eye 2 (two) times daily. 12/14/21  Yes [provider]  omeprazole (PRILOSEC) 20 MG capsule Take 20 mg by mouth daily.   Yes [provider]  pravastatin (PRAVACHOL) 80 MG tablet Take 80 mg by mouth at bedtime. 04/25/22  Yes [provider]  telmisartan (MICARDIS) 80 MG tablet Take 80 mg by mouth daily.   Yes [provider]  allopurinol (ZYLOPRIM) 300 MG tablet Take 300 mg by mouth daily. Patient not taking: Reported on 12/03/2021    [provider]  aspirin 81 MG tablet Take 81 mg by mouth daily.    [provider]  Cholecalciferol (VITAMIN D) 2000 units CAPS Take 2,000 Units by mouth daily.    [provider]  Cyanocobalamin (B-12) 3000 MCG CAPS Take 3,000 mcg by mouth daily.    [provider]  ibuprofen (ADVIL) 200 MG tablet Take 200 mg by mouth every 6 (six) hours as needed.    [provider]  triamcinolone ointment (KENALOG) 0.5 % Apply 1 application topically daily as needed (rash and itching).  12/27/15   [provider]     Critical care time: 40 minutes      Pt currently stable no ICU needs PCCM will sign off.  If you need further assistance please call on call pager# in [place], [place] Pager [phone] (please enter 7 digits) PCCM Consult Pager [phone] (please enter 7 digits)

## 2022-07-14 ENCOUNTER — Inpatient Hospital Stay: Payer: PPO

## 2022-07-14 DIAGNOSIS — R1010 Upper abdominal pain, unspecified: Secondary | ICD-10-CM

## 2022-07-14 DIAGNOSIS — E876 Hypokalemia: Secondary | ICD-10-CM | POA: Diagnosis not present

## 2022-07-14 DIAGNOSIS — A419 Sepsis, unspecified organism: Secondary | ICD-10-CM | POA: Diagnosis not present

## 2022-07-14 DIAGNOSIS — E663 Overweight: Secondary | ICD-10-CM | POA: Diagnosis not present

## 2022-07-14 DIAGNOSIS — N1831 Chronic kidney disease, stage 3a: Secondary | ICD-10-CM

## 2022-07-14 DIAGNOSIS — R6520 Severe sepsis without septic shock: Secondary | ICD-10-CM

## 2022-07-14 LAB — URINE CULTURE: Culture: NO GROWTH

## 2022-07-14 LAB — RESPIRATORY PANEL BY PCR

## 2022-07-14 LAB — COMPREHENSIVE METABOLIC PANEL
ALT: 36 U/L (ref 0–44)
AST: 44 U/L — ABNORMAL HIGH (ref 15–41)
Albumin: 2.7 g/dL — ABNORMAL LOW (ref 3.5–5.0)
Alkaline Phosphatase: 56 U/L (ref 38–126)
Anion gap: 10 (ref 5–15)
BUN: 15 mg/dL (ref 8–23)
CO2: 24 mmol/L (ref 22–32)
Calcium: 7.8 mg/dL — ABNORMAL LOW (ref 8.9–10.3)
Chloride: 106 mmol/L (ref 98–111)
Creatinine, Ser: 1.34 mg/dL — ABNORMAL HIGH (ref 0.61–1.24)
GFR, Estimated: 54 mL/min — ABNORMAL LOW (ref >60)
Glucose, Bld: 117 mg/dL — ABNORMAL HIGH (ref 70–99)
Potassium: 3.1 mmol/L — ABNORMAL LOW (ref 3.5–5.1)
Sodium: 140 mmol/L (ref 135–145)
Total Bilirubin: 1 mg/dL (ref 0.3–1.2)
Total Protein: 6.2 g/dL — ABNORMAL LOW (ref 6.5–8.1)

## 2022-07-14 LAB — CBC
HCT: 37.1 % — ABNORMAL LOW (ref 39.0–52.0)
Hemoglobin: 12.1 g/dL — ABNORMAL LOW (ref 13.0–17.0)
MCH: 30.6 pg (ref 26.0–34.0)
MCHC: 32.6 g/dL (ref 30.0–36.0)
MCV: 93.9 fL (ref 80.0–100.0)
Platelets: 145 10*3/uL — ABNORMAL LOW (ref 150–400)
RBC: 3.95 MIL/uL — ABNORMAL LOW (ref 4.22–5.81)
RDW: 14.2 % (ref 11.5–15.5)
WBC: 7.6 10*3/uL (ref 4.0–10.5)
nRBC: 0 % (ref 0.0–0.2)

## 2022-07-14 LAB — C DIFFICILE QUICK SCREEN W PCR REFLEX
C Diff antigen: NEGATIVE
C Diff interpretation: NOT DETECTED
C Diff toxin: NEGATIVE

## 2022-07-14 LAB — LACTIC ACID, PLASMA
Lactic Acid, Venous: 2.2 mmol/L (ref 0.5–1.9)
Lactic Acid, Venous: 2.4 mmol/L (ref 0.5–1.9)
Lactic Acid, Venous: 1.7 mmol/L (ref 0.5–1.9)

## 2022-07-14 LAB — TROPONIN I (HIGH SENSITIVITY): Troponin I (High Sensitivity): 24 ng/L — ABNORMAL HIGH (ref <18)

## 2022-07-14 LAB — PROCALCITONIN
Procalcitonin: 19.1 ng/mL
Procalcitonin: 18.31 ng/mL

## 2022-07-14 MED ORDER — IOHEXOL 300 MG/ML  SOLN
100.0000 mL | Freq: Once | INTRAMUSCULAR | Status: AC | PRN
  Administered 2022-07-15: 100 mL via INTRAVENOUS

## 2022-07-14 MED ORDER — POTASSIUM CHLORIDE IN NACL 40-0.9 MEQ/L-% IV SOLN
INTRAVENOUS | Status: DC
  Filled 2022-07-14 (×8): qty 1000

## 2022-07-14 MED ORDER — PRAVASTATIN SODIUM 40 MG PO TABS
80.0000 mg | ORAL_TABLET | Freq: Every day | ORAL | Status: DC
  Administered 2022-07-14 – 2022-07-15 (×2): 80 mg via ORAL
  Filled 2022-07-14: qty 4
  Filled 2022-07-14: qty 2

## 2022-07-14 MED ORDER — BRIMONIDINE TARTRATE-TIMOLOL 0.2-0.5 % OP SOLN
1.0000 [drp] | Freq: Two times a day (BID) | OPHTHALMIC | Status: DC
  Filled 2022-07-14: qty 5

## 2022-07-14 MED ORDER — IOHEXOL 9 MG/ML PO SOLN: 500.0000 mL | ORAL | Status: AC

## 2022-07-14 MED ORDER — DORZOLAMIDE HCL 2 % OP SOLN
1.0000 [drp] | Freq: Two times a day (BID) | OPHTHALMIC | Status: DC
  Administered 2022-07-14 – 2022-07-15 (×4): 1 [drp] via OPHTHALMIC
  Filled 2022-07-14: qty 10

## 2022-07-14 MED ORDER — IPRATROPIUM-ALBUTEROL 0.5-2.5 (3) MG/3ML IN SOLN: 3.0000 mL | RESPIRATORY_TRACT | Status: DC | PRN

## 2022-07-14 MED ORDER — BRIMONIDINE TARTRATE 0.2 % OP SOLN
1.0000 [drp] | Freq: Two times a day (BID) | OPHTHALMIC | Status: DC
  Administered 2022-07-15 – 2022-07-16 (×2): 1 [drp] via OPHTHALMIC
  Filled 2022-07-14: qty 5

## 2022-07-14 MED ORDER — TIMOLOL MALEATE 0.5 % OP SOLN
1.0000 [drp] | Freq: Two times a day (BID) | OPHTHALMIC | Status: DC
  Administered 2022-07-16: 1 [drp] via OPHTHALMIC
  Filled 2022-07-14: qty 5

## 2022-07-14 NOTE — Assessment & Plan Note (Addendum)
Aggressive fluid resuscitation, better than baseline

## 2022-07-14 NOTE — Hospital Course (Addendum)
Patient is a 82 year old male with past medical history of hypertension and hyperlipidemia with UTI started on oral antibiotics on 1/17.  Patient states that around this time, he had been struggling with constipation x 1 week.   (He normally has a bowel movement twice a day without assistance. )  Patient took 2 Dulcolax suppositories from his wife started having uncontrolled diarrhea for several days.  On 1/19, while patient was in the bathroom the bathroom, he fell.  Wife became concerned and had the patient brought into the emergency room.  In the emergency room, patient was complaining of abdominal pain and noted to have SIRS criteria plus hypotension, but no source.  CT scan of abdomen pelvis noted increased enhancement of the appendiceal mucosa without other CT findings for appendicitis.  No evidence of colitis and urinalysis and lung bases were unremarkable.  Patient 100% on room air.  Lab work initially in the emergency room, noted a procalcitonin of 1.23, normal white blood cell count of 7.8 and creatinine of 1.31 with lactate of 2.5.  Patient given IV fluids and antibiotics.  General surgery evaluated patient and felt that this was not related to appendicitis.  Patient admitted to the hospitalist service on morning of 1/20.  Patient started to decompensate with persistent hypotension, tachycardia and tachypnea on afternoon of 1/20.  Follow-up labs that afternoon more concerning with white blood cell count of 15.6 and lactic acid level of 8.4.  Critical care consult and source still not apparent.  Patient put on fluids and antibiotics and placed in stepdown overnight.  Follow-up lactic acid levels have started to trend downwards and down to 2.2 by 1/20 night.  By following day, white blood cell count had normalized and initial procalcitonin markedly elevated at 19.  Labs since then have been continued to trend downwards and patient is feeling better.

## 2022-07-14 NOTE — Assessment & Plan Note (Signed)
Meets criteria with BMI greater than 25 

## 2022-07-14 NOTE — Progress Notes (Signed)
[facility] Progress Note  Patient: Chad Park.    QIO:[id number]  DOA: 07/12/2022    Date of Service: the patient was seen and examined on 07/14/2022  Brief hospital course: Patient is a 82 year old male with past medical history of hypertension and hyperlipidemia with UTI started on oral antibiotics on 1/17.  Patient states that once he started antibiotics, start developing diarrhea and almost urinary incontinence.  This went on for several days and on 1/19, while patient was in the bathroom the bathroom, he fell.  Wife became concerned and had the patient brought into the emergency room.  In the emergency room, patient was complaining of abdominal pain and noted to have SIRS criteria plus hypotension, but no source.  CT scan of abdomen pelvis noted increased enhancement of the appendiceal mucosa without other CT findings for appendicitis.  No evidence of colitis and urinalysis and lung bases were unremarkable.  Patient 100% on room air.  Lab work initially in the emergency room, noted a procalcitonin of 1.23, normal white blood cell count of 7.8 and creatinine of 1.31 with lactate of 2.5.  Patient given IV fluids and antibiotics.  General surgery evaluated patient and felt that this was not related to appendicitis.  Patient admitted to the hospitalist service on morning of 1/20.  Follow-up labs that afternoon more concerning with white blood cell count of 15.6 and lactic acid level of 8.4.  Critical care consult and source still not apparent.  Patient put on fluids and antibiotics and placed in stepdown overnight.  Follow-up lactic acid levels have started to trend downwards and down to 2.2 by 1/20 night.  By following day, white blood cell count had normalized although lactic acid still staying somewhat elevated around 2.2.   Assessment and Plan: Severe sepsis ([place]) developing into septic shock Initially presented with signs consistent with severe sepsis given persistent hypotension,  fever, tachycardia and tachypnea and following admission, decompensated noting lactic acid level of 8 and worsening infection.  Source is unclear do not think that this is appendicitis although exam notes some significant tenderness in the right lower quadrant area.  Would favor aggressive fluid resuscitation and rechecking CT of abdomen pelvis tomorrow morning.  Procalcitonin markedly elevated today although likely improved from when he decompensated the day prior.  Labs reviewed from outpatient and patient had a pansensitive E. coli UTI that was successfully treated with adequate Ancef.  Other potential sources could be diarrhea (C. difficile negative, awaiting stool panel) and patient has area of significant tenderness at base of right side of neck.  If follow-up CT is unrevealing, will check for of this area.  Exam reveals no evidence of cellulitis  Chronic kidney disease, stage 3a ([place]) Appears to be at baseline.  Falls Most likely related to hypotension We will place him on fall precautions PT evaluate and treat once appropriate  Hiatal hernia with GERD Continue IV PPI  Hypokalemia Secondary to GI losses from poor oral intake, nausea and vomiting Supplement potassium and magnesium stable  Overweight (BMI 25.0-29.9) Meets criteria with BMI greater than 25       Body mass index is 27.12 kg/m.        Consultants: General surgery Critical care  Procedures: None  Antimicrobials: IV Zosyn 1/20-present IV vancomycin 1/20 x 1 dose  Code Status: Full code   Subjective: Patient doing okay, complains only of abdominal tenderness when it is palpated.  Denies any shortness of breath or trouble swallowing  Objective: Vital signs were  reviewed and unremarkable. Vitals:   07/14/22 1300 07/14/22 1400  BP: 108/69 105/64  Pulse: 83 78  Resp: 20 19  Temp:    SpO2: 97% 95%    Intake/Output Summary (Last 24 hours) at 07/14/2022 1631 Last data filed at 07/13/2022 2347 Gross  per 24 hour  Intake 2041.3 ml  Output 250 ml  Net 1791.3 ml   Filed Weights   07/12/22 2326  Weight: 71.7 kg   Body mass index is 27.12 kg/m.  Exam:  General: Alert and oriented x 3, no acute distress HEENT: Normocephalic, atraumatic, mucous membranes are moist.  Tonsils are nonswollen.  Patient has a area of tenderness at the base of the right side of his neck with minimal induration Cardiovascular: Regular rate and rhythm, S1-S2 Respiratory: Clear to auscultation bilaterally Abdomen: Soft, significantly distended with no ascites or fluid wave, significant tenderness at right lower quadrant, hypoactive bowel sounds Musculoskeletal: No clubbing cyanosis or edema Skin: No skin breaks, tears or lesions.  No evidence of cellulitis Psychiatry: Appropriate, no evidence of psychoses Neurology: No focal deficits  Data Reviewed: Procalcitonin this morning at 19, down to 18 several hours later. White blood cell count normalized. Creatinine at 1.36  Disposition:  Status is: Inpatient Remains inpatient appropriate because:  -Stabilization of infection -Identification of source of infection    Anticipated discharge date: 1/23  Family Communication: Several family members at bedside DVT Prophylaxis: heparin injection 5,000 Units Start: 07/13/22 0600 SCDs Start: 07/13/22 0258    Author: Annita Brod ,MD 07/14/2022 4:31 PM  To reach On-call, see care teams to locate the attending and reach out via [email or web address]. Between 7PM-7AM, please contact night-coverage If you still have difficulty reaching the attending provider, please page the [facility] (Director on Call) for [facility] on amion for assistance.

## 2022-07-14 NOTE — Progress Notes (Signed)
CC: sepsis Subjective: Febrile, lactate high likely related to fever and increase metabolic response producing acidosis  Objective: Vital signs in last 24 hours: Temp:  [97.6 F (36.4 C)-98.2 F (36.8 C)] 98.2 F (36.8 C) (01/21 1600) Pulse Rate:  [68-98] 71 (01/21 1800) Resp:  [10-21] 15 (01/21 1800) BP: (96-122)/(57-76) 121/76 (01/21 1800) SpO2:  [94 %-100 %] 97 % (01/21 1800)    Intake/Output from previous day: 01/20 0701 - 01/21 0700 In: 2041.3 [I.V.:804.6; IV Piggyback:1236.7] Out: 250 [Urine:250] Intake/Output this shift: No intake/output data recorded.  Physical exam:  Chronically ill Abd: mildy distended, no rebound, no tenderness  Lab Results: CBC  Recent Labs    07/13/22 1357 07/14/22 0924  WBC 15.6* 7.6  HGB 13.7 12.1*  HCT 42.7 37.1*  PLT 195 145*   BMET Recent Labs    07/13/22 1000 07/14/22 0924  NA 136 140  K 3.3* 3.1*  CL 105 106  CO2 21* 24  GLUCOSE 107* 117*  BUN 17 15  CREATININE 1.36* 1.34*  CALCIUM 7.9* 7.8*   PT/INR Recent Labs    07/12/22 2332  LABPROT 15.5*  INR 1.2   ABG Recent Labs    07/13/22 1405  PHART 7.43  HCO3 17.3*    Studies/Results: DG Abd Portable 2V  Result Date: 07/14/2022 CLINICAL DATA:  Status post fall, worsening weakness, worsening p.o. intake EXAM: PORTABLE ABDOMEN - 2 VIEW COMPARISON:  02/21/2009 FINDINGS: Oral contrast material within the bladder from recent CT of abdomen/pelvis. No bowel dilatation to suggest obstruction. No evidence of pneumoperitoneum, portal venous gas or pneumatosis. No pathologic calcifications along the expected course of the ureters. No acute osseous abnormality. IMPRESSION: 1. No acute abdominal abnormality. Electronically Signed   By: Kathreen Devoid M.D.   On: 07/14/2022 09:52   CT ANGIO ABDOMEN W &/OR WO CONTRAST  Result Date: 07/13/2022 CLINICAL DATA:  Septic arterial embolism, lower abdominal pain and sepsis EXAM: CT ANGIOGRAPHY ABDOMEN AND PELVIS TECHNIQUE: Multidetector  CT imaging of the abdomen and pelvis was performed using the standard protocol during bolus administration of intravenous contrast. Multiplanar reconstructed images and MIPs were obtained and reviewed to evaluate the vascular anatomy. RADIATION DOSE REDUCTION: This exam was performed according to the departmental dose-optimization program which includes automated exposure control, adjustment of the mA and/or kV according to patient size and/or use of iterative reconstruction technique. CONTRAST:  133m OMNIPAQUE IOHEXOL 350 MG/ML SOLN COMPARISON:  Same-day CT abdomen pelvis, 07/13/2022 FINDINGS: VASCULAR Normal contour and caliber of the abdominal aorta. No evidence of aneurysm, dissection, or other acute aortic pathology. Small duplicated left inferior pole renal artery with a solitary right renal artery and otherwise standard branching pattern of the abdominal aorta. Mild mixed calcific atherosclerosis. Prominent impression of the diaphragmatic crus on the origin of the celiac axis, which is narrowed greater than 50%, however remains patent (series 14, image 95). Remaining aortic branch vessels are patent. Review of the MIP images confirms the above findings. NON-VASCULAR Lower chest: No acute abnormality. Hepatobiliary: No solid liver abnormality is seen. Tiny gallstone (series 10, image 41. Gallbladder wall thickening, or biliary dilatation. Pancreas: Unremarkable. No pancreatic ductal dilatation or surrounding inflammatory changes. Spleen: Normal in size without significant abnormality. Adrenals/Urinary Tract: Adrenal glands are unremarkable. Punctuate nonobstructive calculi of the superior pole of the left kidney. No right-sided calculi, ureteral calculi, or hydronephrosis. Simple, benign parapelvic and cortical cysts of the inferior pole of the left kidney, for which no further follow-up or characterization is required. Urinary bladder is unremarkable.  Stomach/Bowel: Stomach is within normal limits. No  evidence of bowel wall thickening, distention, or inflammatory changes. Descending and sigmoid diverticulosis. Rectal temperature probe. Vascular/Lymphatic: No significant vascular findings are present. No enlarged abdominal lymph nodes. Reproductive: No mass or significant abnormality. Other: Small, fat containing left inguinal hernia.  No ascites. Musculoskeletal: No acute or significant osseous findings. IMPRESSION: 1. Normal contour and caliber of the abdominal aorta. No evidence of aneurysm, dissection, or other acute aortic pathology. Mild mixed calcific atherosclerosis. 2. Prominent impression of the diaphragmatic crus on the origin of the celiac axis, which is narrowed greater than 50%, however remains patent. This is potentially symptomatic; correlate for clinical signs and symptoms of median arcuate ligament syndrome. 3. The remaining aortic branch vessels are patent. 4. No evidence of end-organ septic emboli. 5. Punctuate nonobstructive left nephrolithiasis. No right-sided calculi, ureteral calculi, or hydronephrosis. 6. Cholelithiasis without evidence of acute cholecystitis. 7. Descending and sigmoid diverticulosis without evidence of acute diverticulitis. Aortic Atherosclerosis (ICD10-I70.0). Electronically Signed   By: Delanna Ahmadi M.D.   On: 07/13/2022 16:30   DG Chest Port 1 View  Result Date: 07/13/2022 CLINICAL DATA:  Shortness of breath. EXAM: PORTABLE CHEST 1 VIEW COMPARISON:  07/13/2022 at 5:53 a.m., 07/12/2022 FINDINGS: Patient is slightly rotated to the right. Lungs are adequately inflated without focal airspace consolidation or effusion. Cardiomediastinal silhouette and remainder of the exam is unchanged. IMPRESSION: No active disease. Electronically Signed   By: Marin Olp M.D.   On: 07/13/2022 14:08   DG Chest Portable 1 View  Result Date: 07/13/2022 CLINICAL DATA:  Sepsis. EXAM: PORTABLE CHEST 1 VIEW COMPARISON:  07/12/2022 FINDINGS: Stable cardiomediastinal contours. Lung  volumes are low. No pleural effusion or edema. No airspace opacities identified. Visualized osseous structures are unremarkable. IMPRESSION: Low lung volumes. No acute findings. Electronically Signed   By: Kerby Moors M.D.   On: 07/13/2022 06:18   CT ABDOMEN PELVIS W CONTRAST  Result Date: 07/13/2022 CLINICAL DATA:  Lower abdominal pain and sepsis. EXAM: CT ABDOMEN AND PELVIS WITH CONTRAST TECHNIQUE: Multidetector CT imaging of the abdomen and pelvis was performed using the standard protocol following bolus administration of intravenous contrast. RADIATION DOSE REDUCTION: This exam was performed according to the departmental dose-optimization program which includes automated exposure control, adjustment of the mA and/or kV according to patient size and/or use of iterative reconstruction technique. CONTRAST:  128m OMNIPAQUE IOHEXOL 300 MG/ML  SOLN COMPARISON:  CT abdomen pelvis dated 02/25/2019. FINDINGS: Lower chest: Minimal bibasilar subpleural atelectasis. The visualized lung bases are otherwise clear. There is a 3 mm nodule along the minor fissure (10/4). Coronary vascular calcification of the LAD. No intra-abdominal free air or free fluid. Hepatobiliary: The liver is unremarkable. There is a small stone in the neck of the gallbladder. No pericholecystic fluid. No biliary ductal dilatation. Pancreas: Unremarkable. No pancreatic ductal dilatation or surrounding inflammatory changes. Spleen: Normal in size without focal abnormality. Adrenals/Urinary Tract: The adrenal glands are unremarkable. Small left renal inferior pole cysts. No imaging follow-up. There is no hydronephrosis on either side. There is symmetric enhancement and excretion of contrast by both kidneys. The visualized ureters and urinary bladder appear unremarkable. Stomach/Bowel: There is sigmoid diverticulosis without active inflammatory changes. There is no bowel obstruction. There is slight increased enhancement of the appendiceal mucosa.  The appendix measures up to 9 mm in thickness. No significant inflammatory changes adjacent to the appendix. An early acute appendicitis is less likely but not excluded clinical correlation is recommended. Vascular/Lymphatic: Mild aortoiliac atherosclerotic disease.  The IVC is unremarkable. No portal venous gas. There is no adenopathy. Reproductive: Slight heterogeneity of the prostate gland. Subcentimeter hypodense focus in the left prostate noted which may represent a focal area of inflammation/prostatitis. Underlying nodule/neoplasm is not excluded. Correlation with clinical exam and PSA levels recommended. Dedicated prostate MRI or transrectal ultrasound may provide better evaluation of the prostate on a nonemergent/outpatient basis, if clinically indicated. Other: Small fat containing left inguinal hernia. Musculoskeletal: Degenerative changes of the spine. No acute osseous pathology. IMPRESSION: 1. Slight increased enhancement of the appendiceal mucosa without other CT findings of acute appendicitis. An early acute appendicitis is less likely but not excluded clinical correlation is recommended. 2. Sigmoid diverticulosis without active inflammatory changes. No bowel obstruction. 3. Cholelithiasis. 4. Heterogeneity of the prostate gland. Clinical correlation is recommended to evaluate for possibility of prostatitis. 5. A 3 mm nodule along the minor fissure. No follow-up needed if patient is low-risk.This recommendation follows the consensus statement: Guidelines for Management of Incidental Pulmonary Nodules Detected on CT Images: From the Fleischner Society 2017; Radiology 2017; 284:228-243. 6.  Aortic Atherosclerosis (ICD10-I70.0). Electronically Signed   By: Anner Crete M.D.   On: 07/13/2022 01:01   CT HEAD WO CONTRAST (5MM)  Result Date: 07/13/2022 CLINICAL DATA:  Altered mental status, left pupillary dilation EXAM: CT HEAD WITHOUT CONTRAST TECHNIQUE: Contiguous axial images were obtained from the  base of the skull through the vertex without intravenous contrast. RADIATION DOSE REDUCTION: This exam was performed according to the departmental dose-optimization program which includes automated exposure control, adjustment of the mA and/or kV according to patient size and/or use of iterative reconstruction technique. COMPARISON:  None Available. FINDINGS: Brain: Normal anatomic configuration. Parenchymal volume loss is commensurate with the patient's age. Mild periventricular white matter changes are present likely reflecting the sequela of small vessel ischemia. No abnormal intra or extra-axial mass lesion or fluid collection. No abnormal mass effect or midline shift. No evidence of acute intracranial hemorrhage or infarct. Ventricular size is normal. Cerebellum unremarkable. Vascular: No asymmetric hyperdense vasculature at the skull base. Skull: Intact Sinuses/Orbits: Paranasal sinuses are clear. Orbits are unremarkable. Other: Mastoid air cells and middle ear cavities are clear. IMPRESSION: 1. No acute intracranial hemorrhage or infarct. 2. Mild senescent change. Electronically Signed   By: Fidela Salisbury M.D.   On: 07/13/2022 00:59   DG Chest Port 1 View  Result Date: 07/12/2022 CLINICAL DATA:  Status post fall. EXAM: PORTABLE CHEST 1 VIEW COMPARISON:  October 18, 2020 FINDINGS: The heart size and mediastinal contours are within normal limits. There is no evidence of an acute infiltrate, pleural effusion or pneumothorax. The visualized skeletal structures are unremarkable. IMPRESSION: No active cardiopulmonary disease. Electronically Signed   By: Virgina Norfolk M.D.   On: 07/12/2022 23:54    Anti-infectives: Anti-infectives (From admission, onward)    Start     Dose/Rate Route Frequency Ordered Stop   07/13/22 1730  vancomycin (VANCOREADY) IVPB 1250 mg/250 mL        1,250 mg 166.7 mL/hr over 90 Minutes Intravenous  Once 07/13/22 1638 07/13/22 2244   07/13/22 0900  piperacillin-tazobactam  (ZOSYN) IVPB 3.375 g        3.375 g 12.5 mL/hr over 240 Minutes Intravenous Every 8 hours 07/13/22 0302 07/20/22 0859   07/13/22 0245  piperacillin-tazobactam (ZOSYN) IVPB 3.375 g  Status:  Discontinued        3.375 g 100 mL/hr over 30 Minutes Intravenous  Once 07/13/22 0236 07/13/22 0321  Assessment/Plan:  Sepsis of unclear etiology.  No definitive source found yet.  He had 2 CT scans [place] first 1 was very soft for possible early appendicitis only by radiology call second did not go this.  Clinically no evidence of appendicitis.  I do not think surgical intervention is in the best interest of the patient.  I will continue to look for source.  Today his abdominal exam is benign.  We will continue to follow. Please note that I have spent 35 minutes in this encounter including personally reviewing imaging studies, coordinating his care, placing orders, counseling the patient and performing appropriate documentation          Caroleen Hamman, MD, FACS  07/14/2022

## 2022-07-14 NOTE — Progress Notes (Signed)
Pt began having chills, shaking, SOB, His O2 dropped to low 80's and tachycardic 150's.Provider notified and rapid response initiated.

## 2022-07-14 NOTE — Progress Notes (Signed)
Cooling blanket removed, patient has been normothermic for 6 hours. Will monitor temperature closely.

## 2022-07-15 ENCOUNTER — Inpatient Hospital Stay: Payer: PPO

## 2022-07-15 ENCOUNTER — Encounter: Payer: Self-pay | Admitting: Surgery

## 2022-07-15 DIAGNOSIS — A419 Sepsis, unspecified organism: Secondary | ICD-10-CM | POA: Diagnosis not present

## 2022-07-15 DIAGNOSIS — E663 Overweight: Secondary | ICD-10-CM | POA: Diagnosis not present

## 2022-07-15 DIAGNOSIS — R1010 Upper abdominal pain, unspecified: Secondary | ICD-10-CM | POA: Diagnosis not present

## 2022-07-15 DIAGNOSIS — E876 Hypokalemia: Secondary | ICD-10-CM | POA: Diagnosis not present

## 2022-07-15 DIAGNOSIS — N1831 Chronic kidney disease, stage 3a: Secondary | ICD-10-CM | POA: Diagnosis not present

## 2022-07-15 LAB — CBC
HCT: 35.6 % — ABNORMAL LOW (ref 39.0–52.0)
Hemoglobin: 11.6 g/dL — ABNORMAL LOW (ref 13.0–17.0)
MCH: 30.5 pg (ref 26.0–34.0)
MCHC: 32.6 g/dL (ref 30.0–36.0)
MCV: 93.7 fL (ref 80.0–100.0)
Platelets: 153 10*3/uL (ref 150–400)
RBC: 3.8 MIL/uL — ABNORMAL LOW (ref 4.22–5.81)
RDW: 14.4 % (ref 11.5–15.5)
WBC: 5.1 10*3/uL (ref 4.0–10.5)
nRBC: 0 % (ref 0.0–0.2)

## 2022-07-15 LAB — BASIC METABOLIC PANEL
Anion gap: 7 (ref 5–15)
BUN: 13 mg/dL (ref 8–23)
CO2: 21 mmol/L — ABNORMAL LOW (ref 22–32)
Calcium: 7.8 mg/dL — ABNORMAL LOW (ref 8.9–10.3)
Chloride: 111 mmol/L (ref 98–111)
Creatinine, Ser: 1.22 mg/dL (ref 0.61–1.24)
GFR, Estimated: >60 mL/min (ref >60)
Glucose, Bld: 86 mg/dL (ref 70–99)
Potassium: 4.4 mmol/L (ref 3.5–5.1)
Sodium: 139 mmol/L (ref 135–145)

## 2022-07-15 LAB — GLUCOSE, CAPILLARY: Glucose-Capillary: 95 mg/dL (ref 70–99)

## 2022-07-15 LAB — PROCALCITONIN: Procalcitonin: 8.67 ng/mL

## 2022-07-15 MED ORDER — IOHEXOL 9 MG/ML PO SOLN
500.0000 mL | Freq: Once | ORAL | Status: DC | PRN
  Administered 2022-07-15: 500 mL via ORAL

## 2022-07-15 MED ORDER — ALPRAZOLAM 0.5 MG PO TABS
0.5000 mg | ORAL_TABLET | Freq: Three times a day (TID) | ORAL | Status: DC | PRN
  Administered 2022-07-15: 0.5 mg via ORAL
  Filled 2022-07-15 (×2): qty 1

## 2022-07-15 NOTE — Progress Notes (Addendum)
CC: Abd pain sepsis Subjective: Feeling better  Tolerating liquids CT pers reviewed today, no evidence of appendicitis, no free air  C diff negative and so far w/u negative  Objective: Vital signs in last 24 hours: Temp:  [97.8 F (36.6 C)-98.6 F (37 C)] 98.6 F (37 C) (01/22 1305) Pulse Rate:  [59-96] 96 (01/22 0830) Resp:  [12-27] 27 (01/22 0830) BP: (107-146)/(68-81) 146/81 (01/22 0830) SpO2:  [91 %-98 %] 96 % (01/22 0830) Last BM Date : 07/15/22  Intake/Output from previous day: 01/21 0701 - 01/22 0700 In: 926.4 [I.V.:926.4] Out: 1175 [Urine:1175] Intake/Output this shift: Total I/O In: 2131.8 [I.V.:2039.7; IV Piggyback:92.1] Out: -   Physical exam:  NAD alert Abd: soft, nt some distension w/o peritonitis Ext: well perfused and no edema  Lab Results: CBC  Recent Labs    07/14/22 0924 07/15/22 0646  WBC 7.6 5.1  HGB 12.1* 11.6*  HCT 37.1* 35.6*  PLT 145* 153   BMET Recent Labs    07/14/22 0924 07/15/22 0646  NA 140 139  K 3.1* 4.4  CL 106 111  CO2 24 21*  GLUCOSE 117* 86  BUN 15 13  CREATININE 1.34* 1.22  CALCIUM 7.8* 7.8*   PT/INR Recent Labs    07/12/22 2332  LABPROT 15.5*  INR 1.2   ABG Recent Labs    07/13/22 1405  PHART 7.43  HCO3 17.3*    Studies/Results: DG Abd Portable 2V  Result Date: 07/14/2022 CLINICAL DATA:  Status post fall, worsening weakness, worsening p.o. intake EXAM: PORTABLE ABDOMEN - 2 VIEW COMPARISON:  02/21/2009 FINDINGS: Oral contrast material within the bladder from recent CT of abdomen/pelvis. No bowel dilatation to suggest obstruction. No evidence of pneumoperitoneum, portal venous gas or pneumatosis. No pathologic calcifications along the expected course of the ureters. No acute osseous abnormality. IMPRESSION: 1. No acute abdominal abnormality. Electronically Signed   By: Kathreen Devoid M.D.   On: 07/14/2022 09:52    Anti-infectives: Anti-infectives (From admission, onward)    Start     Dose/Rate Route  Frequency Ordered Stop   07/13/22 1730  vancomycin (VANCOREADY) IVPB 1250 mg/250 mL        1,250 mg 166.7 mL/hr over 90 Minutes Intravenous  Once 07/13/22 1638 07/13/22 2244   07/13/22 0900  piperacillin-tazobactam (ZOSYN) IVPB 3.375 g        3.375 g 12.5 mL/hr over 240 Minutes Intravenous Every 8 hours 07/13/22 0302 07/20/22 0859   07/13/22 0245  piperacillin-tazobactam (ZOSYN) IVPB 3.375 g  Status:  Discontinued        3.375 g 100 mL/hr over 30 Minutes Intravenous  Once 07/13/22 0236 07/13/22 0321       Assessment/Plan: Sepsis of unclear etiology.  No definitive source found yet.  He had 3 CT scans no evidence of appendicitis or surgical process. Clinically benign abdomen.  I do not think surgical intervention is in the best interest of the patient.  We will be available, not much to add from surgical perspective  Please note that I have spent 50 minutes in this encounter including personally reviewing imaging studies, coordinating his care, placing orders, counseling the patient and performing appropriate documentation   Caroleen Hamman, MD, FACS  07/15/2022

## 2022-07-15 NOTE — Progress Notes (Signed)
[facility] Progress Note  Patient: Chad Park.    OIN:[id number]  DOA: 07/12/2022    Date of Service: the patient was seen and examined on 07/15/2022  Brief hospital course: Patient is a 82 year old male with past medical history of hypertension and hyperlipidemia with UTI started on oral antibiotics on 1/17.  Patient states that around this time, he had been struggling with constipation x 1 week.   (He normally has a bowel movement twice a day without assistance. )  Patient took 2 Dulcolax suppositories from his wife started having uncontrolled diarrhea for several days.  On 1/19, while patient was in the bathroom the bathroom, he fell.  Wife became concerned and had the patient brought into the emergency room.  In the emergency room, patient was complaining of abdominal pain and noted to have SIRS criteria plus hypotension, but no source.  CT scan of abdomen pelvis noted increased enhancement of the appendiceal mucosa without other CT findings for appendicitis.  No evidence of colitis and urinalysis and lung bases were unremarkable.  Patient 100% on room air.  Lab work initially in the emergency room, noted a procalcitonin of 1.23, normal white blood cell count of 7.8 and creatinine of 1.31 with lactate of 2.5.  Patient given IV fluids and antibiotics.  General surgery evaluated patient and felt that this was not related to appendicitis.  Patient admitted to the hospitalist service on morning of 1/20.  Patient started to decompensate with persistent hypotension, tachycardia and tachypnea on afternoon of 1/20.  Follow-up labs that afternoon more concerning with white blood cell count of 15.6 and lactic acid level of 8.4.  Critical care consult and source still not apparent.  Patient put on fluids and antibiotics and placed in stepdown overnight.  Follow-up lactic acid levels have started to trend downwards and down to 2.2 by 1/20 night.  By following day, white blood cell count had normalized and  initial procalcitonin markedly elevated at 19.  Labs since then have been continued to trend downwards and patient is feeling better.   Assessment and Plan: Severe sepsis ([place]) developing into septic shock Initially presented with signs consistent with severe sepsis given persistent hypotension, fever, tachycardia and tachypnea and following admission, decompensated noting lactic acid level of 8 and worsening infection.  Suspect source is from retained stool causing bacterial translocation of the gut given abdominal tenderness.  Repeat CT unremarkable and noted no signs of questionable appendiceal involvement that was seen on for CT.  Markedly elevated procalcitonin of 19 has improved and is down to 8.724 hours later.  With fluid resuscitation, lactic acid level now normalized.  No evidence of other source of infection.  Labs reviewed from outpatient and patient had a pansensitive E. coli UTI that was successfully treated with adequate Ancef.  No evidence of pneumonia.  Exam reveals no evidence of cellulitis  Chronic kidney disease, stage 3a (HCC) Aggressive fluid resuscitation, better than baseline  Falls Likely related to hypotension and early infection.  PT to see.  Hiatal hernia with GERD Continue IV PPI  Hypokalemia-resolved as of 07/15/2022 Secondary to GI losses from poor oral intake, nausea and vomiting Supplement potassium and magnesium stable  Overweight (BMI 25.0-29.9) Meets criteria with BMI greater than 25       Body mass index is 27.12 kg/m.        Consultants: General surgery Critical care  Procedures: None  Antimicrobials: IV Zosyn 1/20-present IV vancomycin 1/20 x 1 dose  Code Status: Full code  Subjective: Patient feeling much better today.  Objective: Vital signs were reviewed and unremarkable. Vitals:   07/15/22 1305 07/15/22 1600  BP:  (!) 144/82  Pulse:  66  Resp:  20  Temp: 98.6 F (37 C) 98.5 F (36.9 C)  SpO2:  98%     Intake/Output Summary (Last 24 hours) at 07/15/2022 1744 Last data filed at 07/15/2022 1639 Gross per 24 hour  Intake 3538.13 ml  Output 1876 ml  Net 1662.13 ml    Filed Weights   07/12/22 2326  Weight: 71.7 kg   Body mass index is 27.12 kg/m.  Exam:  General: Alert and oriented x 3, no acute distress HEENT: Normocephalic, atraumatic, mucous membranes are moist.  Patient has a area of tenderness at the base of the right side of his neck with minimal induration.  Questionable calcified vessel. Cardiovascular: Regular rate and rhythm, S1-S2 Respiratory: Clear to auscultation bilaterally Abdomen: Soft, less distended distended with no ascites or fluid wave, tenderness in right lower quadrant much improved.  Hypoactive bowel sounds Musculoskeletal: No clubbing cyanosis or edema Skin: No skin breaks, tears or lesions.  No evidence of cellulitis Psychiatry: Appropriate, no evidence of psychoses Neurology: No focal deficits  Data Reviewed: Procalcitonin down to 8.7.  Lactic acid down to 1.7. Creatinine down to 1.22  Disposition:  Status is: Inpatient Remains inpatient appropriate because:  -Additional IV antibiotics    Anticipated discharge date: 1/23  Family Communication: Several family members at bedside DVT Prophylaxis: heparin injection 5,000 Units Start: 07/13/22 0600 SCDs Start: 07/13/22 0258    Author: Annita Brod ,MD 07/15/2022 5:44 PM  To reach On-call, see care teams to locate the attending and reach out via [email or web address]. Between 7PM-7AM, please contact night-coverage If you still have difficulty reaching the attending provider, please page the [facility] (Director on Call) for [facility] on amion for assistance.

## 2022-07-16 DIAGNOSIS — N1831 Chronic kidney disease, stage 3a: Secondary | ICD-10-CM | POA: Diagnosis not present

## 2022-07-16 DIAGNOSIS — E663 Overweight: Secondary | ICD-10-CM | POA: Diagnosis not present

## 2022-07-16 DIAGNOSIS — K5909 Other constipation: Secondary | ICD-10-CM | POA: Diagnosis not present

## 2022-07-16 DIAGNOSIS — R6521 Severe sepsis with septic shock: Secondary | ICD-10-CM

## 2022-07-16 LAB — BLOOD GAS, ARTERIAL
Acid-base deficit: 5.4 mmol/L — ABNORMAL HIGH (ref 0.0–2.0)
Bicarbonate: 17.3 mmol/L — ABNORMAL LOW (ref 20.0–28.0)
O2 Saturation: 99.6 %
Patient temperature: 37
pCO2 arterial: 26 mmHg — ABNORMAL LOW (ref 32–48)
pH, Arterial: 7.43 (ref 7.35–7.45)
pO2, Arterial: 133 mmHg — ABNORMAL HIGH (ref 83–108)

## 2022-07-16 LAB — PROCALCITONIN: Procalcitonin: 5.89 ng/mL

## 2022-07-16 MED ORDER — METRONIDAZOLE 500 MG PO TABS: 500.0000 mg | ORAL_TABLET | Freq: Three times a day (TID) | ORAL | 0 refills | Status: AC

## 2022-07-16 MED ORDER — CIPROFLOXACIN HCL 500 MG PO TABS: 500.0000 mg | ORAL_TABLET | Freq: Two times a day (BID) | ORAL | 0 refills | Status: AC

## 2022-07-16 MED ORDER — ONDANSETRON HCL 4 MG PO TABS: 4.0000 mg | ORAL_TABLET | Freq: Three times a day (TID) | ORAL | 1 refills | Status: DC | PRN

## 2022-07-16 NOTE — Discharge Summary (Signed)
Physician Discharge Summary   Patient: Chad Park. MRN: [id number] DOB: 08/22/1940  Admit date:     07/12/2022  Discharge date: {dischdate:26783}  Discharge Physician: Annita Brod   PCP: Maryland Pink, MD   Recommendations at discharge:  {Tip this will not be part of the note when signed- Example include specific recommendations for outpatient follow-up, pending tests to follow-up on. (Optional):26781}  ***  Discharge Diagnoses: Active Problems:   Severe sepsis (HCC) developing into septic shock   Chronic kidney disease, stage 3a ([place])   Falls   Hiatal hernia with GERD   Overweight (BMI 25.0-29.9)  Resolved Problems:   Hypokalemia  Hospital Course: Patient is a 82 year old male with past medical history of hypertension and hyperlipidemia with UTI started on oral antibiotics on 1/17.  Patient states that around this time, he had been struggling with constipation x 1 week.   (He normally has a bowel movement twice a day without assistance. )  Patient took 2 Dulcolax suppositories from his wife started having uncontrolled diarrhea for several days.  On 1/19, while patient was in the bathroom the bathroom, he fell.  Wife became concerned and had the patient brought into the emergency room.  In the emergency room, patient was complaining of abdominal pain and noted to have SIRS criteria plus hypotension, but no source.  CT scan of abdomen pelvis noted increased enhancement of the appendiceal mucosa without other CT findings for appendicitis.  No evidence of colitis and urinalysis and lung bases were unremarkable.  Patient 100% on room air.  Lab work initially in the emergency room, noted a procalcitonin of 1.23, normal white blood cell count of 7.8 and creatinine of 1.31 with lactate of 2.5.  Patient given IV fluids and antibiotics.  General surgery evaluated patient and felt that this was not related to appendicitis.  Patient admitted to the hospitalist service on morning of  1/20.  Patient started to decompensate with persistent hypotension, tachycardia and tachypnea on afternoon of 1/20.  Follow-up labs that afternoon more concerning with white blood cell count of 15.6 and lactic acid level of 8.4.  Critical care consult and source still not apparent.  Patient put on fluids and antibiotics and placed in stepdown overnight.  Follow-up lactic acid levels have started to trend downwards and down to 2.2 by 1/20 night.  By following day, white blood cell count had normalized and initial procalcitonin markedly elevated at 19.  Labs since then have been continued to trend downwards and patient is feeling better.  Assessment and Plan: Severe sepsis ([place]) developing into septic shock Initially presented with signs consistent with severe sepsis given persistent hypotension, fever, tachycardia and tachypnea and following admission, decompensated noting lactic acid level of 8 and worsening infection.  Suspect source is from retained stool causing bacterial translocation of the gut given abdominal tenderness.  Repeat CT unremarkable and noted no signs of questionable appendiceal involvement that was seen on for CT.  Markedly elevated procalcitonin of 19 has improved and is down to 8.724 hours later.  With fluid resuscitation, lactic acid level now normalized.  No evidence of other source of infection.  Labs reviewed from outpatient and patient had a pansensitive E. coli UTI that was successfully treated with adequate Ancef.  No evidence of pneumonia.  Exam reveals no evidence of cellulitis  Chronic kidney disease, stage 3a (HCC) Aggressive fluid resuscitation, better than baseline  Falls Likely related to hypotension and early infection.  PT to see.  Hiatal hernia with  GERD Continue IV PPI  Hypokalemia-resolved as of 07/15/2022 Secondary to GI losses from poor oral intake, nausea and vomiting Supplement potassium and magnesium stable  Overweight (BMI 25.0-29.9) Meets criteria  with BMI greater than 25      {Tip this will not be part of the note when signed Body mass index is 27.12 kg/m. , ,  (Optional):26781}  {(NOTE) Pain control PDMP Statment (Optional):26782} Consultants: *** Procedures performed: ***  Disposition: {Plan; Disposition:26390} Diet recommendation:  Discharge Diet Orders (From admission, onward)     Start     Ordered   07/16/22 0000  Diet - low sodium heart healthy        07/16/22 1144           {Diet_Plan:26776} DISCHARGE MEDICATION: Allergies as of 07/16/2022       Reactions   Zocor [simvastatin] Other (See Comments)   headache   Doxycycline Rash   Rash and burning of hands        Medication List     TAKE these medications    amLODipine 5 MG tablet Commonly known as: NORVASC Take 5 mg by mouth daily.   aspirin 81 MG tablet Take 81 mg by mouth daily.   B-12 3000 MCG Caps Take 3,000 mcg by mouth daily.   brimonidine-timolol 0.2-0.5 % ophthalmic solution Commonly known as: COMBIGAN Place 1 drop into the left eye every 12 (twelve) hours.   ciprofloxacin 500 MG tablet Commonly known as: Cipro Take 1 tablet (500 mg total) by mouth 2 (two) times daily for 5 days.   diclofenac 75 MG EC tablet Commonly known as: VOLTAREN Take 75 mg by mouth 2 (two) times daily.   dorzolamide 2 % ophthalmic solution Commonly known as: TRUSOPT Place 1 drop into the left eye 2 (two) times daily.   ibuprofen 200 MG tablet Commonly known as: ADVIL Take 200 mg by mouth every 6 (six) hours as needed.   metroNIDAZOLE 500 MG tablet Commonly known as: Flagyl Take 1 tablet (500 mg total) by mouth 3 (three) times daily for 5 days.   omeprazole 20 MG capsule Commonly known as: PRILOSEC Take 20 mg by mouth daily.   ondansetron 4 MG tablet Commonly known as: Zofran Take 1 tablet (4 mg total) by mouth every 8 (eight) hours as needed for nausea or vomiting.   pravastatin 80 MG tablet Commonly known as: PRAVACHOL Take 80 mg by  mouth at bedtime.   telmisartan 80 MG tablet Commonly known as: MICARDIS Take 80 mg by mouth daily.   triamcinolone ointment 0.5 % Commonly known as: KENALOG Apply 1 application topically daily as needed (rash and itching).   Vitamin D 50 MCG (2000 UT) Caps Take 2,000 Units by mouth daily.        Follow-up Information     Pabon, [place] F, MD Follow up in 4 week(s).   Specialty: General Surgery Contact information: [address] [place] [address] [phone]                Discharge Exam: Danley Danker Weights   07/12/22 2326  Weight: 71.7 kg   ***  Condition at discharge: {DC Condition:26389}  The results of significant diagnostics from this hospitalization (including imaging, microbiology, ancillary and laboratory) are listed below for reference.   Imaging Studies: CT ABDOMEN PELVIS W WO CONTRAST  Result Date: 07/15/2022 CLINICAL DATA:  Sepsis EXAM: CT ABDOMEN AND PELVIS WITHOUT AND WITH CONTRAST TECHNIQUE: Multidetector CT imaging of the abdomen and pelvis was performed  following the standard protocol before and following the bolus administration of intravenous contrast. RADIATION DOSE REDUCTION: This exam was performed according to the departmental dose-optimization program which includes automated exposure control, adjustment of the mA and/or kV according to patient size and/or use of iterative reconstruction technique. CONTRAST:  170m OMNIPAQUE IOHEXOL 300 MG/ML  SOLN COMPARISON:  None Available. FINDINGS: Lower chest: Trace bilateral pleural effusions and bibasilar atelectasis. Hepatobiliary: No focal liver abnormality. Gallstones with no evidence of gallbladder wall thickening. No biliary ductal dilation. Pancreas: Unremarkable. No pancreatic ductal dilatation or surrounding inflammatory changes. Spleen: Normal in size without focal abnormality. Adrenals/Urinary Tract: Bilateral adrenal glands are unremarkable. No hydronephrosis. Punctate  nonobstructive left nephrolithiasis. Simple appearing cysts of the left kidney, no specific follow-up imaging is recommended. Bladder is unremarkable. Stomach/Bowel: Stomach is within normal limits. Appendix appears normal. Diverticulosis. No evidence of bowel wall thickening, distention, or inflammatory changes. Vascular/Lymphatic: Aortic atherosclerosis. No enlarged abdominal or pelvic lymph nodes. Reproductive: Prostate is unremarkable. Other: No abdominal wall hernia. Tiny locules of gas are seen in the right lateral abdominal wall on series 7, image 48, likely due to subcutaneous injection. No abdominopelvic ascites. No intraperitoneal free air. Musculoskeletal: No acute or significant osseous findings. IMPRESSION: 1. No acute findings in the abdomen or pelvis. 2. Trace bilateral pleural effusions and bibasilar atelectasis. 3. Punctate nonobstructive left nephrolithiasis. 4. Aortic Atherosclerosis (ICD10-I70.0). Electronically Signed   By: LYetta GlassmanM.D.   On: 07/15/2022 16:40   DG Abd Portable 2V  Result Date: 07/14/2022 CLINICAL DATA:  Status post fall, worsening weakness, worsening p.o. intake EXAM: PORTABLE ABDOMEN - 2 VIEW COMPARISON:  02/21/2009 FINDINGS: Oral contrast material within the bladder from recent CT of abdomen/pelvis. No bowel dilatation to suggest obstruction. No evidence of pneumoperitoneum, portal venous gas or pneumatosis. No pathologic calcifications along the expected course of the ureters. No acute osseous abnormality. IMPRESSION: 1. No acute abdominal abnormality. Electronically Signed   By: HKathreen DevoidM.D.   On: 07/14/2022 09:52   CT ANGIO ABDOMEN W &/OR WO CONTRAST  Result Date: 07/13/2022 CLINICAL DATA:  Septic arterial embolism, lower abdominal pain and sepsis EXAM: CT ANGIOGRAPHY ABDOMEN AND PELVIS TECHNIQUE: Multidetector CT imaging of the abdomen and pelvis was performed using the standard protocol during bolus administration of intravenous contrast. Multiplanar  reconstructed images and MIPs were obtained and reviewed to evaluate the vascular anatomy. RADIATION DOSE REDUCTION: This exam was performed according to the departmental dose-optimization program which includes automated exposure control, adjustment of the mA and/or kV according to patient size and/or use of iterative reconstruction technique. CONTRAST:  109mOMNIPAQUE IOHEXOL 350 MG/ML SOLN COMPARISON:  Same-day CT abdomen pelvis, 07/13/2022 FINDINGS: VASCULAR Normal contour and caliber of the abdominal aorta. No evidence of aneurysm, dissection, or other acute aortic pathology. Small duplicated left inferior pole renal artery with a solitary right renal artery and otherwise standard branching pattern of the abdominal aorta. Mild mixed calcific atherosclerosis. Prominent impression of the diaphragmatic crus on the origin of the celiac axis, which is narrowed greater than 50%, however remains patent (series 14, image 95). Remaining aortic branch vessels are patent. Review of the MIP images confirms the above findings. NON-VASCULAR Lower chest: No acute abnormality. Hepatobiliary: No solid liver abnormality is seen. Tiny gallstone (series 10, image 4529 Gallbladder wall thickening, or biliary dilatation. Pancreas: Unremarkable. No pancreatic ductal dilatation or surrounding inflammatory changes. Spleen: Normal in size without significant abnormality. Adrenals/Urinary Tract: Adrenal glands are unremarkable. Punctuate nonobstructive calculi of the superior pole of the  left kidney. No right-sided calculi, ureteral calculi, or hydronephrosis. Simple, benign parapelvic and cortical cysts of the inferior pole of the left kidney, for which no further follow-up or characterization is required. Urinary bladder is unremarkable. Stomach/Bowel: Stomach is within normal limits. No evidence of bowel wall thickening, distention, or inflammatory changes. Descending and sigmoid diverticulosis. Rectal temperature probe.  Vascular/Lymphatic: No significant vascular findings are present. No enlarged abdominal lymph nodes. Reproductive: No mass or significant abnormality. Other: Small, fat containing left inguinal hernia.  No ascites. Musculoskeletal: No acute or significant osseous findings. IMPRESSION: 1. Normal contour and caliber of the abdominal aorta. No evidence of aneurysm, dissection, or other acute aortic pathology. Mild mixed calcific atherosclerosis. 2. Prominent impression of the diaphragmatic crus on the origin of the celiac axis, which is narrowed greater than 50%, however remains patent. This is potentially symptomatic; correlate for clinical signs and symptoms of median arcuate ligament syndrome. 3. The remaining aortic branch vessels are patent. 4. No evidence of end-organ septic emboli. 5. Punctuate nonobstructive left nephrolithiasis. No right-sided calculi, ureteral calculi, or hydronephrosis. 6. Cholelithiasis without evidence of acute cholecystitis. 7. Descending and sigmoid diverticulosis without evidence of acute diverticulitis. Aortic Atherosclerosis (ICD10-I70.0). Electronically Signed   By: Delanna Ahmadi M.D.   On: 07/13/2022 16:30   DG Chest Port 1 View  Result Date: 07/13/2022 CLINICAL DATA:  Shortness of breath. EXAM: PORTABLE CHEST 1 VIEW COMPARISON:  07/13/2022 at 5:53 a.m., 07/12/2022 FINDINGS: Patient is slightly rotated to the right. Lungs are adequately inflated without focal airspace consolidation or effusion. Cardiomediastinal silhouette and remainder of the exam is unchanged. IMPRESSION: No active disease. Electronically Signed   By: Marin Olp M.D.   On: 07/13/2022 14:08   DG Chest Portable 1 View  Result Date: 07/13/2022 CLINICAL DATA:  Sepsis. EXAM: PORTABLE CHEST 1 VIEW COMPARISON:  07/12/2022 FINDINGS: Stable cardiomediastinal contours. Lung volumes are low. No pleural effusion or edema. No airspace opacities identified. Visualized osseous structures are unremarkable. IMPRESSION:  Low lung volumes. No acute findings. Electronically Signed   By: Kerby Moors M.D.   On: 07/13/2022 06:18   CT ABDOMEN PELVIS W CONTRAST  Result Date: 07/13/2022 CLINICAL DATA:  Lower abdominal pain and sepsis. EXAM: CT ABDOMEN AND PELVIS WITH CONTRAST TECHNIQUE: Multidetector CT imaging of the abdomen and pelvis was performed using the standard protocol following bolus administration of intravenous contrast. RADIATION DOSE REDUCTION: This exam was performed according to the departmental dose-optimization program which includes automated exposure control, adjustment of the mA and/or kV according to patient size and/or use of iterative reconstruction technique. CONTRAST:  183m OMNIPAQUE IOHEXOL 300 MG/ML  SOLN COMPARISON:  CT abdomen pelvis dated 02/25/2019. FINDINGS: Lower chest: Minimal bibasilar subpleural atelectasis. The visualized lung bases are otherwise clear. There is a 3 mm nodule along the minor fissure (10/4). Coronary vascular calcification of the LAD. No intra-abdominal free air or free fluid. Hepatobiliary: The liver is unremarkable. There is a small stone in the neck of the gallbladder. No pericholecystic fluid. No biliary ductal dilatation. Pancreas: Unremarkable. No pancreatic ductal dilatation or surrounding inflammatory changes. Spleen: Normal in size without focal abnormality. Adrenals/Urinary Tract: The adrenal glands are unremarkable. Small left renal inferior pole cysts. No imaging follow-up. There is no hydronephrosis on either side. There is symmetric enhancement and excretion of contrast by both kidneys. The visualized ureters and urinary bladder appear unremarkable. Stomach/Bowel: There is sigmoid diverticulosis without active inflammatory changes. There is no bowel obstruction. There is slight increased enhancement of the appendiceal mucosa. The  appendix measures up to 9 mm in thickness. No significant inflammatory changes adjacent to the appendix. An early acute appendicitis is  less likely but not excluded clinical correlation is recommended. Vascular/Lymphatic: Mild aortoiliac atherosclerotic disease. The IVC is unremarkable. No portal venous gas. There is no adenopathy. Reproductive: Slight heterogeneity of the prostate gland. Subcentimeter hypodense focus in the left prostate noted which may represent a focal area of inflammation/prostatitis. Underlying nodule/neoplasm is not excluded. Correlation with clinical exam and PSA levels recommended. Dedicated prostate MRI or transrectal ultrasound may provide better evaluation of the prostate on a nonemergent/outpatient basis, if clinically indicated. Other: Small fat containing left inguinal hernia. Musculoskeletal: Degenerative changes of the spine. No acute osseous pathology. IMPRESSION: 1. Slight increased enhancement of the appendiceal mucosa without other CT findings of acute appendicitis. An early acute appendicitis is less likely but not excluded clinical correlation is recommended. 2. Sigmoid diverticulosis without active inflammatory changes. No bowel obstruction. 3. Cholelithiasis. 4. Heterogeneity of the prostate gland. Clinical correlation is recommended to evaluate for possibility of prostatitis. 5. A 3 mm nodule along the minor fissure. No follow-up needed if patient is low-risk.This recommendation follows the consensus statement: Guidelines for Management of Incidental Pulmonary Nodules Detected on CT Images: From the Fleischner Society 2017; Radiology 2017; 284:228-243. 6.  Aortic Atherosclerosis (ICD10-I70.0). Electronically Signed   By: Anner Crete M.D.   On: 07/13/2022 01:01   CT HEAD WO CONTRAST (5MM)  Result Date: 07/13/2022 CLINICAL DATA:  Altered mental status, left pupillary dilation EXAM: CT HEAD WITHOUT CONTRAST TECHNIQUE: Contiguous axial images were obtained from the base of the skull through the vertex without intravenous contrast. RADIATION DOSE REDUCTION: This exam was performed according to the  departmental dose-optimization program which includes automated exposure control, adjustment of the mA and/or kV according to patient size and/or use of iterative reconstruction technique. COMPARISON:  None Available. FINDINGS: Brain: Normal anatomic configuration. Parenchymal volume loss is commensurate with the patient's age. Mild periventricular white matter changes are present likely reflecting the sequela of small vessel ischemia. No abnormal intra or extra-axial mass lesion or fluid collection. No abnormal mass effect or midline shift. No evidence of acute intracranial hemorrhage or infarct. Ventricular size is normal. Cerebellum unremarkable. Vascular: No asymmetric hyperdense vasculature at the skull base. Skull: Intact Sinuses/Orbits: Paranasal sinuses are clear. Orbits are unremarkable. Other: Mastoid air cells and middle ear cavities are clear. IMPRESSION: 1. No acute intracranial hemorrhage or infarct. 2. Mild senescent change. Electronically Signed   By: Fidela Salisbury M.D.   On: 07/13/2022 00:59   DG Chest Port 1 View  Result Date: 07/12/2022 CLINICAL DATA:  Status post fall. EXAM: PORTABLE CHEST 1 VIEW COMPARISON:  October 18, 2020 FINDINGS: The heart size and mediastinal contours are within normal limits. There is no evidence of an acute infiltrate, pleural effusion or pneumothorax. The visualized skeletal structures are unremarkable. IMPRESSION: No active cardiopulmonary disease. Electronically Signed   By: Virgina Norfolk M.D.   On: 07/12/2022 23:54    Microbiology: Results for orders placed or performed during the hospital encounter of 07/12/22  Blood Culture (routine x 2)     Status: None (Preliminary result)   Collection Time: 07/12/22 11:32 PM   Specimen: BLOOD  Result Value Ref Range Status   Specimen Description BLOOD BLOOD LEFT ARM  Final   Special Requests   Final    BOTTLES DRAWN AEROBIC AND ANAEROBIC Blood Culture results may not be optimal due to an inadequate volume of  blood received in culture  bottles   Culture   Final    NO GROWTH 4 DAYS Performed at [facility], [address] [place], [place] [address]    Report Status PENDING  Incomplete  Blood Culture (routine x 2)     Status: None (Preliminary result)   Collection Time: 07/12/22 11:32 PM   Specimen: BLOOD  Result Value Ref Range Status   Specimen Description BLOOD BLOOD LEFT FOREARM  Final   Special Requests   Final    BOTTLES DRAWN AEROBIC AND ANAEROBIC Blood Culture adequate volume   Culture   Final    NO GROWTH 4 DAYS Performed at [facility], [address], [place], [place] [address]    Report Status PENDING  Incomplete  Resp panel by RT-PCR (RSV, Flu A&B, Covid) Anterior Nasal Swab     Status: None   Collection Time: 07/12/22 11:32 PM   Specimen: Anterior Nasal Swab  Result Value Ref Range Status   SARS Coronavirus 2 by RT PCR NEGATIVE NEGATIVE Final    Comment: (NOTE) SARS-CoV-2 target nucleic acids are NOT DETECTED.  The SARS-CoV-2 RNA is generally detectable in upper respiratory specimens during the acute phase of infection. The lowest concentration of SARS-CoV-2 viral copies this assay can detect is 138 copies/mL. A negative result does not preclude SARS-Cov-2 infection and should not be used as the sole basis for treatment or other patient management decisions. A negative result may occur with  improper specimen collection/handling, submission of specimen other than nasopharyngeal swab, presence of viral mutation(s) within the areas targeted by this assay, and inadequate number of viral copies(<138 copies/mL). A negative result must be combined with clinical observations, patient history, and epidemiological information. The expected result is Negative.  Fact Sheet for Patients:  EntrepreneurPulse.com.au  Fact Sheet for Healthcare Providers:  IncredibleEmployment.be  This test is no t yet approved or cleared by  the [place] FDA and  has been authorized for detection and/or diagnosis of SARS-CoV-2 by FDA under an Emergency Use Authorization (EUA). This EUA will remain  in effect (meaning this test can be used) for the duration of the COVID-19 declaration under Section 564(b)(1) of the Act, 21 U.S.C.section 360bbb-3(b)(1), unless the authorization is terminated  or revoked sooner.       Influenza A by PCR NEGATIVE NEGATIVE Final   Influenza B by PCR NEGATIVE NEGATIVE Final    Comment: (NOTE) The Xpert Xpress SARS-CoV-2/FLU/RSV plus assay is intended as an aid in the diagnosis of influenza from Nasopharyngeal swab specimens and should not be used as a sole basis for treatment. Nasal washings and aspirates are unacceptable for Xpert Xpress SARS-CoV-2/FLU/RSV testing.  Fact Sheet for Patients: EntrepreneurPulse.com.au  Fact Sheet for Healthcare Providers: IncredibleEmployment.be  This test is not yet approved or cleared by the [place] FDA and has been authorized for detection and/or diagnosis of SARS-CoV-2 by FDA under an Emergency Use Authorization (EUA). This EUA will remain in effect (meaning this test can be used) for the duration of the COVID-19 declaration under Section 564(b)(1) of the Act, 21 U.S.C. section 360bbb-3(b)(1), unless the authorization is terminated or revoked.     Resp Syncytial Virus by PCR NEGATIVE NEGATIVE Final    Comment: (NOTE) Fact Sheet for Patients: EntrepreneurPulse.com.au  Fact Sheet for Healthcare Providers: IncredibleEmployment.be  This test is not yet approved or cleared by the [place] FDA and has been authorized for detection and/or diagnosis of SARS-CoV-2 by FDA under an Emergency Use Authorization (EUA). This EUA will remain in  effect (meaning this test can be used) for the duration of the COVID-19 declaration under Section 564(b)(1) of the Act, 21  U.S.C. section 360bbb-3(b)(1), unless the authorization is terminated or revoked.  Performed at [facility], [address], [place], [place] [address]   Urine Culture     Status: None   Collection Time: 07/13/22  1:20 AM   Specimen: Urine, Clean Catch  Result Value Ref Range Status   Specimen Description   Final    URINE, CLEAN CATCH Performed at [facility], [address] [place], [place] [address]    Special Requests   Final    NONE Performed at [facility], [address], [place], [place] [address]    Culture   Final    NO GROWTH Performed at [facility], [place] [address] [place], [place] [address]    Report Status 07/14/2022 FINAL  Final  Respiratory (~20 pathogens) panel by PCR     Status: None   Collection Time: 07/13/22  2:26 PM   Specimen: Nasopharyngeal Swab; Respiratory  Result Value Ref Range Status   Adenovirus NOT DETECTED NOT DETECTED Final   Coronavirus 229E NOT DETECTED NOT DETECTED Final    Comment: (NOTE) The Coronavirus on the Respiratory Panel, DOES NOT test for the novel  Coronavirus (2019 nCoV)    Coronavirus HKU1 NOT DETECTED NOT DETECTED Final   Coronavirus NL63 NOT DETECTED NOT DETECTED Final   Coronavirus OC43 NOT DETECTED NOT DETECTED Final   Metapneumovirus NOT DETECTED NOT DETECTED Final   Rhinovirus / Enterovirus NOT DETECTED NOT DETECTED Final   Influenza A NOT DETECTED NOT DETECTED Final   Influenza B NOT DETECTED NOT DETECTED Final   Parainfluenza Virus 1 NOT DETECTED NOT DETECTED Final   Parainfluenza Virus 2 NOT DETECTED NOT DETECTED Final   Parainfluenza Virus 3 NOT DETECTED NOT DETECTED Final   Parainfluenza Virus 4 NOT DETECTED NOT DETECTED Final   Respiratory Syncytial Virus NOT DETECTED NOT DETECTED Final   Bordetella pertussis NOT DETECTED NOT DETECTED Final   Bordetella Parapertussis NOT DETECTED NOT DETECTED Final   Chlamydophila pneumoniae NOT DETECTED NOT DETECTED Final    Mycoplasma pneumoniae NOT DETECTED NOT DETECTED Final    Comment: Performed at [facility], [place] [address], [place], [place] [address]  MRSA Next Gen by PCR, Nasal     Status: None   Collection Time: 07/13/22  4:54 PM   Specimen: Nasal Mucosa; Nasal Swab  Result Value Ref Range Status   MRSA by PCR Next Gen NOT DETECTED NOT DETECTED Final    Comment: (NOTE) The GeneXpert MRSA Assay (FDA approved for NASAL specimens only), is one component of a comprehensive MRSA colonization surveillance program. It is not intended to diagnose MRSA infection nor to guide or monitor treatment for MRSA infections. Test performance is not FDA approved in patients less than 80 years old. Performed at [facility], [address] [place], [place] [address]   C Difficile Quick Screen w PCR reflex     Status: None   Collection Time: 07/14/22 12:36 AM   Specimen: STOOL  Result Value Ref Range Status   C Diff antigen NEGATIVE NEGATIVE Final   C Diff toxin NEGATIVE NEGATIVE Final   C Diff interpretation No C. difficile detected.  Final    Comment: Performed at [facility], [address] [place],  [address]    Labs: CBC: Recent Labs  Lab 07/12/22 2332 07/13/22 1357 07/14/22 [id number]  07/15/22 0646  WBC 7.8 15.6* 7.6 5.1  NEUTROABS 7.0 14.0*  --   --   HGB 14.6 13.7 12.1* 11.6*  HCT 43.5 42.7 37.1* 35.6*  MCV 92.2 96.4 93.9 93.7  PLT 184 195 145* 161   Basic Metabolic Panel: Recent Labs  Lab 07/12/22 2332 07/13/22 1000 07/13/22 1002 07/14/22 0924 07/15/22 0646  NA 135 136  --  140 139  K 3.0* 3.3*  --  3.1* 4.4  CL 103 105  --  106 111  CO2 19* 21*  --  24 21*  GLUCOSE 132* 107*  --  117* 86  BUN 22 17  --  15 13  CREATININE 1.31* 1.36*  --  1.34* 1.22  CALCIUM 8.7* 7.9*  --  7.8* 7.8*  MG  --   --  1.8  --   --   PHOS  --  4.1  --   --   --    Liver Function Tests: Recent Labs  Lab 07/12/22 2332 07/14/22 0924  AST 66* 44*  ALT 51* 36  ALKPHOS 65  56  BILITOT 1.3* 1.0  PROT 7.7 6.2*  ALBUMIN 3.5 2.7*   CBG: Recent Labs  Lab 07/13/22 1351 07/13/22 1644  GLUCAP 82 95    Discharge time spent: {LESS THAN/GREATER THAN:26388} 30 minutes.  Signed: Annita Brod, MD [facility] 07/16/2022

## 2022-07-16 NOTE — TOC Progression Note (Signed)
Transition of Care (TOC) - Progression Note    Patient Details  Name: Chad Park. MRN: [id number] Date of Birth: July 30, 1940  Transition of Care [facility]) CM/SW Contact  Laurena Slimmer, RN Phone Number: 07/16/2022, 10:48 AM  Clinical Narrative:  Case reviewed for needs, and disposition.         Expected Discharge Plan and Services                                               Social Determinants of Health (SDOH) Interventions SDOH Screenings   Tobacco Use: High Risk (07/15/2022)    Readmission Risk Interventions     No data to display

## 2022-07-16 NOTE — Evaluation (Signed)
Physical Therapy Evaluation Patient Details Name: Joesph Marcy. MRN: [id number] DOB: [birth year] Today's Date: 07/16/2022  History of Present Illness  Patient is a 82 year old male with severe sepsis developing into septic shock, falls  Clinical Impression  Patient agreeable to PT. Spouse at the bedside and confirms recent falls were related to acute medical issues, otherwise no other falls reported. Patient is independent with mobility at baseline but has a rolling walker if needed.   Today, the patient is mobilizing without physical assistance. He walked in the hallway with rolling walker with mild fatigue. No dizziness or pain reported with mobility. No significant change in heart rate noted during walking.  Recommend to continue using rolling walker for safety with ambulation. No PT needs anticipate at discharge. PT will follow up while in the hospital to maximize independence      Recommendations for follow up therapy are one component of a multi-disciplinary discharge planning process, led by the attending physician.  Recommendations may be updated based on patient status, additional functional criteria and insurance authorization.  Follow Up Recommendations No PT follow up      Assistance Recommended at Discharge PRN  Patient can return home with the following  Help with stairs or ramp for entrance;Assist for transportation    Equipment Recommendations None recommended by PT  Recommendations for Other Services       Functional Status Assessment Patient has had a recent decline in their functional status and demonstrates the ability to make significant improvements in function in a reasonable and predictable amount of time.     Precautions / Restrictions Precautions Precautions: Fall Restrictions Weight Bearing Restrictions: No      Mobility  Bed Mobility Overal bed mobility: Needs Assistance Bed Mobility: Supine to Sit, Sit to Supine     Supine to sit:  Supervision Sit to supine: Supervision        Transfers Overall transfer level: Needs assistance Equipment used: Rolling walker (2 wheels) Transfers: Sit to/from Stand Sit to Stand: Min guard           General transfer comment: no dizziness reported with standing    Ambulation/Gait Ambulation/Gait assistance: Supervision Gait Distance (Feet): 100 Feet Assistive device: Rolling walker (2 wheels) Gait Pattern/deviations: Step-through pattern Gait velocity: decreased     General Gait Details: no loss of balance with ambulation. encouragement provided to use rolling walker for safety with ambulation until strength and endurance improves as patient has essentially been in the bed for 4 days now. patient reports he has a rolling walker at home already  [employer]    Modified Rankin (Stroke Patients Only)       Balance Overall balance assessment: Needs assistance Sitting-balance support: Feet supported Sitting balance-Leahy Scale: Good     Standing balance support: Bilateral upper extremity supported, During functional activity Standing balance-Leahy Scale: Fair Standing balance comment: using rolling walker for support in standing. no external support required from therapist                             Pertinent Vitals/Pain Pain Assessment Pain Assessment: No/denies pain    Home Living Family/patient expects to be discharged to:: Private residence Living Arrangements: Spouse/significant other Available Help at Discharge: Family Type of Home: House Home Access: Stairs to enter Entrance Stairs-Rails: Right Entrance Stairs-Number of Steps: 2   Home Layout: One level Home  Equipment: Conservation officer, nature (2 wheels)      Prior Function Prior Level of Function : Independent/Modified Independent                     Hand Dominance        Extremity/Trunk Assessment   Upper Extremity Assessment Upper Extremity  Assessment: Overall WFL for tasks assessed    Lower Extremity Assessment Lower Extremity Assessment: Generalized weakness (endurance impaired for sustained activity in standing)       Communication   Communication: No difficulties  Cognition Arousal/Alertness: Awake/alert Behavior During Therapy: WFL for tasks assessed/performed Overall Cognitive Status: Within Functional Limits for tasks assessed                                          General Comments      Exercises     Assessment/Plan    PT Assessment Patient needs continued PT services  PT Problem List Decreased strength;Decreased activity tolerance;Decreased balance;Decreased mobility       PT Treatment Interventions DME instruction;Stair training;Gait training;Functional mobility training;Therapeutic activities;Therapeutic exercise;Balance training;Neuromuscular re-education;Patient/family education    PT Goals (Current goals can be found in the Care Plan section)  Acute Rehab PT Goals Patient Stated Goal: to go home PT Goal Formulation: With patient Time For Goal Achievement: 07/30/22 Potential to Achieve Goals: Good    Frequency Min 2X/week     Co-evaluation               AM-PAC PT "6 Clicks" Mobility  Outcome Measure Help needed turning from your back to your side while in a flat bed without using bedrails?: None Help needed moving from lying on your back to sitting on the side of a flat bed without using bedrails?: None Help needed moving to and from a bed to a chair (including a wheelchair)?: A Little Help needed standing up from a chair using your arms (e.g., wheelchair or bedside chair)?: A Little Help needed to walk in hospital room?: A Little Help needed climbing 3-5 steps with a railing? : A Little 6 Click Score: 20    End of Session Equipment Utilized During Treatment: Gait belt Activity Tolerance: Patient tolerated treatment well Patient left: in bed;with call  bell/phone within reach;with family/visitor present   PT Visit Diagnosis: Unsteadiness on feet (R26.81);Muscle weakness (generalized) (M62.81)    Time: 1009-1020 PT Time Calculation (min) (ACUTE ONLY): 11 min   Charges:   PT Evaluation $PT Eval Low Complexity: 1 Low          Minna Merritts, PT, MPT   Percell Locus 07/16/2022, 11:08 AM

## 2022-07-16 NOTE — TOC Initial Note (Signed)
Transition of Care (TOC) - Initial/Assessment Note    Patient Details  Name: Chad Park. MRN: [id number] Date of Birth: 29-Jan-1941  Transition of Care [facility] CM/SW Contact:    Laurena Slimmer, RN Phone Number: 07/16/2022, 12:04 PM  Clinical Narrative:                  Transition of Care [facility]) Screening Note   Patient Details  Name: Chad Park. Date of Birth: Nov 01, 1940   Transition of Care [facility]) CM/SW Contact:    Laurena Slimmer, RN Phone Number: 07/16/2022, 12:04 PM    Transition of Care Department [facility]) has reviewed patient and no TOC needs have been identified at this time. We will continue to monitor patient advancement through interdisciplinary progression rounds. If new patient transition needs arise, please place a TOC consult.          Patient Goals and CMS Choice            Expected Discharge Plan and Services         Expected Discharge Date: 07/16/22                                    Prior Living Arrangements/Services                       Activities of Daily Living      Permission Sought/Granted                  Emotional Assessment              Admission diagnosis:  Abdominal pain [R10.9] Acute appendicitis with localized peritonitis, without perforation, abscess, or gangrene [K35.30] Appendicitis [K37] Patient Active Problem List   Diagnosis Date Noted   Overweight (BMI 25.0-29.9) 07/14/2022   Chronic kidney disease, stage 3a ([place]) 07/14/2022   Abdominal pain 07/13/2022   Severe sepsis ([place]) developing into septic shock 07/13/2022   Falls 07/13/2022   Hiatal hernia with GERD 07/13/2022   Angina decubitus 11/22/2016   COLONIC POLYPS, RECURRENT 08/24/2010   DYSLIPIDEMIA 08/24/2010   UNSPECIFIED ESSENTIAL HYPERTENSION 08/24/2010   PCP:  Maryland Pink, MD Pharmacy:   [facility] [id number], [place] [address] Phone: [phone]  Fax: [phone]     Social Determinants of Health (SDOH) Social History: SDOH Screenings   Tobacco Use: High Risk (07/15/2022)   SDOH Interventions:     Readmission Risk Interventions     No data to display

## 2022-07-16 NOTE — Care Management Important Message (Signed)
Important Message  Patient Details  Name: Chad Park. MRN: [id number] Date of Birth: 1941-04-19   Medicare Important Message Given:  Yes     Dannette Barbara 07/16/2022, 9:44 AM

## 2022-07-17 DIAGNOSIS — K5900 Constipation, unspecified: Secondary | ICD-10-CM | POA: Diagnosis present

## 2022-07-17 DIAGNOSIS — N390 Urinary tract infection, site not specified: Secondary | ICD-10-CM | POA: Diagnosis present

## 2022-07-17 LAB — CULTURE, BLOOD (ROUTINE X 2)
Culture: NO GROWTH
Culture: NO GROWTH
Special Requests: ADEQUATE

## 2022-07-17 NOTE — Assessment & Plan Note (Signed)
UTI considered as source of sepsis, however UA on admission completely normalized.  Will respect the patient went into septic shock even if he had been on antibiotics before coming in for urinary tract infection, there would have been some residual infection seen on urinalysis.  Was able to see records from office visit and urinalysis done in office prior to admission grew out pansensitive E. coli which was fully treated with antibiotics prior to patient's presentation.

## 2022-07-17 NOTE — Assessment & Plan Note (Signed)
Patient normally has bowel movements twice a day without any medication assistance.  By the time that he was seeing his PCP for urinary tract infection, he had been constipated x 1 week.  Suspect that was secondary to dehydration brought on by UTI.  Around the same time antibiotics started at home for UTI, patient took 2 Dulcolax tablets that his wife normally takes.  Patient states that for the next 2 days, he had nonstop diarrhea, at times even incontinent and uncontrolled.  This would account for why there was no stool seen on CT, and also the likely source of his infection given likely bacterial translocation of the gut.

## 2022-07-20 LAB — NOROVIRUS GROUP 1 & 2 BY PCR, STOOL

## 2022-07-26 DIAGNOSIS — R300 Dysuria: Secondary | ICD-10-CM | POA: Diagnosis not present

## 2022-08-14 ENCOUNTER — Ambulatory Visit (INDEPENDENT_AMBULATORY_CARE_PROVIDER_SITE_OTHER): Payer: PPO | Admitting: Surgery

## 2022-08-14 ENCOUNTER — Encounter: Payer: Self-pay | Admitting: Surgery

## 2022-08-14 VITALS — BP 151/89 | HR 92 | Temp 97.9°F | Ht 64.5 in | Wt 154.2 lb

## 2022-08-14 DIAGNOSIS — R109 Unspecified abdominal pain: Secondary | ICD-10-CM

## 2022-08-14 NOTE — Patient Instructions (Addendum)
Your CT is scheduled for 08/22/2022 3:30 pm (arrive by 3:15 pm) at [facility]. Nothing to eat or drink 4 hours prior.    If you have any concerns or questions, please feel free to call our office.   Abdominal Pain, Adult Many things can cause belly (abdominal) pain. Most times, belly pain is not dangerous. Many cases of belly pain can be watched and treated at home. Sometimes, though, belly pain is serious. Your doctor will try to find the cause of your belly pain. Follow these instructions at home:  Medicines Take over-the-counter and prescription medicines only as told by your doctor. Do not take medicines that help you poop (laxatives) unless told by your doctor. General instructions Watch your belly pain for any changes. Drink enough fluid to keep your pee (urine) pale yellow. Keep all follow-up visits as told by your doctor. This is important. Contact a doctor if: Your belly pain changes or gets worse. You are not hungry, or you lose weight without trying. You are having trouble pooping (constipated) or have watery poop (diarrhea) for more than 2-3 days. You have pain when you pee or poop. Your belly pain wakes you up at night. Your pain gets worse with meals, after eating, or with certain foods. You are vomiting and cannot keep anything down. You have a fever. You have blood in your pee. Get help right away if: Your pain does not go away as soon as your doctor says it should. You cannot stop vomiting. Your pain is only in areas of your belly, such as the right side or the left lower part of the belly. You have bloody or black poop, or poop that looks like tar. You have very bad pain, cramping, or bloating in your belly. You have signs of not having enough fluid or water in your body (dehydration), such as: Dark pee, very little pee, or no pee. Cracked lips. Dry mouth. Sunken eyes. Sleepiness. Weakness. You have trouble breathing or chest pain. Summary Many cases  of belly pain can be watched and treated at home. Watch your belly pain for any changes. Take over-the-counter and prescription medicines only as told by your doctor. Contact a doctor if your belly pain changes or gets worse. Get help right away if you have very bad pain, cramping, or bloating in your belly. This information is not intended to replace advice given to you by your health care provider. Make sure you discuss any questions you have with your health care provider. Document Revised: 10/19/2018 Document Reviewed: 10/19/2018 Elsevier Patient Education  Fellows.

## 2022-08-15 ENCOUNTER — Encounter: Payer: Self-pay | Admitting: Surgery

## 2022-08-15 NOTE — Progress Notes (Signed)
Outpatient Surgical Follow Up  08/15/2022  Chad O Donnis Snowdon. is an 82 y.o. male.   Chief Complaint  Patient presents with   Hospitalization Follow-up    Appendicitis    HPI: 82 year old male seen in follow-up after recent hospitalization for sepsis of unknown origin.  He presented with abdominal pain diarrhea nausea vomiting as well as fever.  He needed appropriate resuscitation with IV fluids and multiple serial exams.  For CTs there was a soft call about appendicitis but clinically I thought he did not have this.  We follow him clinically and he did not need any surgical intervention.  He had a repeat CT scan without evidence of appendicitis or any acute intra-abdominal process. Spent significant time explaining to the patient and the family about hospital events  Past Medical History:  Diagnosis Date   Abdominal pain, acute, left lower quadrant 07/23/2018   Cataract Left Eye   Colon polyps    Coronary artery disease    Dysphagia    Encounter for colonoscopy due to history of adenomatous colonic polyps 07/26/2018   GERD (gastroesophageal reflux disease)    Gout    Heart murmur    Hiatal hernia    History of kidney stones    Hyperlipidemia    Hypertension    Kidney stones    Peyronie's disease    Wears dentures    full upper and lower    Past Surgical History:  Procedure Laterality Date   CATARACT EXTRACTION W/PHACO Left 12/05/2021   Procedure: [place] OF LENS LEFT ;  Surgeon: Leandrew Koyanagi, MD;  Location: [facility];  Service: Ophthalmology;  Laterality: Left;   COLONOSCOPY WITH PROPOFOL N/A 03/22/2015   Procedure: COLONOSCOPY WITH PROPOFOL;  Surgeon: Manya Silvas, MD;  Location[facility] ENDOSCOPY;  Service: Endoscopy;  Laterality: N/A;   COLONOSCOPY WITH PROPOFOL N/A 09/07/2018   Procedure: COLONOSCOPY WITH PROPOFOL;  Surgeon: Manya Silvas, MD;  Location[facility] ENDOSCOPY;  Service: Endoscopy;  Laterality: N/A;   COLONOSCOPY,  ESOPHAGOGASTRODUODENOSCOPY (EGD) AND ESOPHAGEAL DILATION     ESOPHAGOGASTRODUODENOSCOPY (EGD) WITH PROPOFOL N/A 03/22/2015   Procedure: ESOPHAGOGASTRODUODENOSCOPY (EGD) WITH PROPOFOL;  Surgeon: Manya Silvas, MD;  Location: [facility] ENDOSCOPY;  Service: Endoscopy;  Laterality: N/A;   ESOPHAGOGASTRODUODENOSCOPY (EGD) WITH PROPOFOL N/A 09/07/2018   Procedure: ESOPHAGOGASTRODUODENOSCOPY (EGD) WITH PROPOFOL;  Surgeon: Manya Silvas, MD;  Location: [facility] ENDOSCOPY;  Service: Endoscopy;  Laterality: N/A;   ESOPHAGOGASTRODUODENOSCOPY (EGD) WITH PROPOFOL N/A 10/18/2020   Procedure: ESOPHAGOGASTRODUODENOSCOPY (EGD) WITH PROPOFOL;  Surgeon: [place], Benay Pike, MD;  Location: [facility] ENDOSCOPY;  Service: Gastroenterology;  Laterality: N/A;   LEFT HEART CATH AND CORONARY ANGIOGRAPHY Left 11/27/2016   Procedure: Left Heart Cath and Coronary Angiography;  Surgeon: Corey Skains, MD;  Location: [place] [facility] CV LAB;  Service: Cardiovascular;  Laterality: Left;   SAVORY DILATION N/A 09/07/2018   Procedure: SAVORY DILATION;  Surgeon: Manya Silvas, MD;  Location: [facility] ENDOSCOPY;  Service: Endoscopy;  Laterality: N/A;    Family History  Problem Relation Age of Onset   Pulmonary embolism Mother    Lung cancer Father    Lung cancer Brother     Social History:  reports that he has quit smoking. His smoking use included cigarettes. His smokeless tobacco use includes chew. He reports current alcohol use of about 2.0 - 3.0 standard drinks of alcohol per week. He reports that he does not use drugs.  Allergies:  Allergies  Allergen Reactions   Zocor [Simvastatin] Other (See Comments)  headache   Doxycycline Rash    Rash and burning of hands    Medications reviewed.    ROS Full ROS performed and is otherwise negative other than what is stated in HPI   BP (!) 151/89   Pulse 92   Temp 97.9 F (36.6 C) (Oral)   Ht 5' 4.5" (1.638 m)   Wt 154 lb 3.2 oz (69.9 kg)   SpO2 97%   BMI 26.06 kg/m    Physical Exam Vitals and nursing note reviewed. Exam conducted with a chaperone present.  Constitutional:      General: He is not in acute distress.    Appearance: Normal appearance. He is normal weight. He is not ill-appearing.  Cardiovascular:     Rate and Rhythm: Normal rate and regular rhythm.     Heart sounds: No murmur heard.    No friction rub.  Pulmonary:     Effort: Pulmonary effort is normal. No respiratory distress.     Breath sounds: Normal breath sounds. No stridor. No wheezing or rhonchi.  Abdominal:     General: Abdomen is flat. There is no distension.     Palpations: Abdomen is soft. There is no mass.     Tenderness: There is no abdominal tenderness. There is no guarding or rebound.     Hernia: No hernia is present.  Musculoskeletal:     Cervical back: Normal range of motion and neck supple. No rigidity or tenderness.  Skin:    General: Skin is warm and dry.     Capillary Refill: Capillary refill takes less than 2 seconds.  Neurological:     General: No focal deficit present.     Mental Status: He is alert and oriented to person, place, and time.  Psychiatric:        Mood and Affect: Mood normal.        Behavior: Behavior normal.        Thought Content: Thought content normal.        Judgment: Judgment normal.    Assessment/Plan: 82 year old male with a recent episode of sepsis of unknown origin.  There was a question at some point in time of appendicitis but clinically his history was not consistent with appendicitis.  Never had received any surgical intervention.  He has recovered well. Was with the patient in detail about the potential need for colonoscopy and at this time he is declining.  He is okay with doing this repeat CT scan of the abdomen pelvis to make sure there is no other intra-abdominal pathology.  Currently no need for hospitalization.  I will see him back after he completes the appropriate workup I spent 40 minutes in this encounter including  personally reviewing imaging studies, coordinating her care, placing orders and performing appropriate documentation  Caroleen Hamman, MD [facility]

## 2022-08-22 ENCOUNTER — Ambulatory Visit
Admission: RE | Admit: 2022-08-22 | Discharge: 2022-08-22 | Disposition: A | Discharge status: Home / Self Care | Payer: PPO | Source: Ambulatory Visit | Attending: Surgery | Admitting: Surgery

## 2022-08-22 DIAGNOSIS — K8020 Calculus of gallbladder without cholecystitis without obstruction: Secondary | ICD-10-CM | POA: Diagnosis not present

## 2022-08-22 DIAGNOSIS — K760 Fatty (change of) liver, not elsewhere classified: Secondary | ICD-10-CM | POA: Diagnosis not present

## 2022-08-22 DIAGNOSIS — A419 Sepsis, unspecified organism: Secondary | ICD-10-CM | POA: Diagnosis not present

## 2022-08-22 DIAGNOSIS — R109 Unspecified abdominal pain: Secondary | ICD-10-CM | POA: Insufficient documentation

## 2022-08-22 DIAGNOSIS — K5730 Diverticulosis of large intestine without perforation or abscess without bleeding: Secondary | ICD-10-CM | POA: Diagnosis not present

## 2022-08-22 MED ORDER — IOHEXOL 300 MG/ML  SOLN
100.0000 mL | Freq: Once | INTRAMUSCULAR | Status: AC | PRN
  Administered 2022-08-22: 100 mL via INTRAVENOUS

## 2022-09-11 ENCOUNTER — Ambulatory Visit (INDEPENDENT_AMBULATORY_CARE_PROVIDER_SITE_OTHER): Payer: PPO | Admitting: Surgery

## 2022-09-11 ENCOUNTER — Other Ambulatory Visit: Payer: Self-pay

## 2022-09-11 ENCOUNTER — Encounter: Payer: Self-pay | Admitting: Surgery

## 2022-09-11 VITALS — BP 142/93 | HR 98 | Temp 98.2°F | Ht 64.5 in | Wt 155.0 lb

## 2022-09-11 DIAGNOSIS — E785 Hyperlipidemia, unspecified: Secondary | ICD-10-CM | POA: Insufficient documentation

## 2022-09-11 DIAGNOSIS — K219 Gastro-esophageal reflux disease without esophagitis: Secondary | ICD-10-CM | POA: Insufficient documentation

## 2022-09-11 DIAGNOSIS — N486 Induration penis plastica: Secondary | ICD-10-CM | POA: Insufficient documentation

## 2022-09-11 DIAGNOSIS — R109 Unspecified abdominal pain: Secondary | ICD-10-CM

## 2022-09-11 DIAGNOSIS — R1310 Dysphagia, unspecified: Secondary | ICD-10-CM | POA: Insufficient documentation

## 2022-09-11 DIAGNOSIS — M109 Gout, unspecified: Secondary | ICD-10-CM | POA: Insufficient documentation

## 2022-09-11 DIAGNOSIS — H25019 Cortical age-related cataract, unspecified eye: Secondary | ICD-10-CM | POA: Insufficient documentation

## 2022-09-11 DIAGNOSIS — N200 Calculus of kidney: Secondary | ICD-10-CM | POA: Insufficient documentation

## 2022-09-11 NOTE — Patient Instructions (Signed)
PLease call if you have questions or concerns.

## 2022-09-13 NOTE — Progress Notes (Signed)
Outpatient Surgical Follow Up  09/13/2022  Chad Park. is an 82 y.o. male.   Chief Complaint  Patient presents with   Follow-up    Abdominal pain discuss CT   HPI: 82 year old male with a recent episode of sepsis of unknown origin.  There was a question at some point in time of appendicitis but clinically his history was not consistent with appendicitis.  Never had received any surgical intervention.  He has recovered well. No complaints. Repeat images showing no acute intra-abdominal pathology. No further intervention or w/u indicated at this time. He had a repeat CT scan without evidence of appendicitis or any acute intra-abdominal process Gallstones but no cholecystitis..    Past Medical History:  Diagnosis Date   Abdominal pain, acute, left lower quadrant 07/23/2018   Cataract Left Eye   Colon polyps    Coronary artery disease    Dysphagia    Encounter for colonoscopy due to history of adenomatous colonic polyps 07/26/2018   GERD (gastroesophageal reflux disease)    Gout    Heart murmur    Hiatal hernia    History of kidney stones    Hyperlipidemia    Hypertension    Kidney stones    Peyronie's disease    Wears dentures    full upper and lower    Past Surgical History:  Procedure Laterality Date   CATARACT EXTRACTION W/PHACO Left 12/05/2021   Procedure: [place] OF LENS LEFT ;  Surgeon: Leandrew Koyanagi, MD;  Location: [facility];  Service: Ophthalmology;  Laterality: Left;   COLONOSCOPY WITH PROPOFOL N/A 03/22/2015   Procedure: COLONOSCOPY WITH PROPOFOL;  Surgeon: Manya Silvas, MD;  Location: [facility] ENDOSCOPY;  Service: Endoscopy;  Laterality: N/A;   COLONOSCOPY WITH PROPOFOL N/A 09/07/2018   Procedure: COLONOSCOPY WITH PROPOFOL;  Surgeon: Manya Silvas, MD;  Location: [facility] ENDOSCOPY;  Service: Endoscopy;  Laterality: N/A;   COLONOSCOPY, ESOPHAGOGASTRODUODENOSCOPY (EGD) AND ESOPHAGEAL DILATION     ESOPHAGOGASTRODUODENOSCOPY (EGD) WITH  PROPOFOL N/A 03/22/2015   Procedure: ESOPHAGOGASTRODUODENOSCOPY (EGD) WITH PROPOFOL;  Surgeon: Manya Silvas, MD;  Location: [facility] ENDOSCOPY;  Service: Endoscopy;  Laterality: N/A;   ESOPHAGOGASTRODUODENOSCOPY (EGD) WITH PROPOFOL N/A 09/07/2018   Procedure: ESOPHAGOGASTRODUODENOSCOPY (EGD) WITH PROPOFOL;  Surgeon: Manya Silvas, MD;  Location: [facility] ENDOSCOPY;  Service: Endoscopy;  Laterality: N/A;   ESOPHAGOGASTRODUODENOSCOPY (EGD) WITH PROPOFOL N/A 10/18/2020   Procedure: ESOPHAGOGASTRODUODENOSCOPY (EGD) WITH PROPOFOL;  Surgeon: Toledo, Benay Pike, MD;  Location: [facility] ENDOSCOPY;  Service: Gastroenterology;  Laterality: N/A;   LEFT HEART CATH AND CORONARY ANGIOGRAPHY Left 11/27/2016   Procedure: Left Heart Cath and Coronary Angiography;  Surgeon: Corey Skains, MD;  Location: [facility] CV LAB;  Service: Cardiovascular;  Laterality: Left;   SAVORY DILATION N/A 09/07/2018   Procedure: SAVORY DILATION;  Surgeon: Manya Silvas, MD;  Location: [facility] ENDOSCOPY;  Service: Endoscopy;  Laterality: N/A;    Family History  Problem Relation Age of Onset   Pulmonary embolism Mother    Lung cancer Father    Lung cancer Brother     Social History:  reports that he has quit smoking. His smoking use included cigarettes. His smokeless tobacco use includes chew. He reports current alcohol use of about 2.0 - 3.0 standard drinks of alcohol per week. He reports that he does not use drugs.  Allergies:  Allergies  Allergen Reactions   Zocor [Simvastatin] Other (See Comments)    headache   Doxycycline Rash    Rash and burning of hands  Medications reviewed.    ROS Full ROS performed and is otherwise negative other than what is stated in HPI   BP (!) 142/93   Pulse 98   Temp 98.2 F (36.8 C) (Oral)   Ht 5' 4.5" (1.638 m)   Wt 155 lb (70.3 kg)   SpO2 96%   BMI 26.19 kg/m   Physical Exam Vitals and nursing note reviewed. Exam conducted with a chaperone present.  Constitutional:       General: He is not in acute distress.    Appearance: Normal appearance. He is normal weight. He is not ill-appearing.  Cardiovascular:     Rate and Rhythm: Normal rate and regular rhythm.     Heart sounds: No murmur heard.    No friction rub.  Pulmonary:     Effort: Pulmonary effort is normal. No respiratory distress.     Breath sounds: Normal breath sounds. No stridor. No wheezing or rhonchi.  Abdominal:     General: Abdomen is flat. There is no distension.     Palpations: Abdomen is soft. There is no mass.     Tenderness: There is no abdominal tenderness. There is no guarding or rebound.     Hernia: No hernia is present.  Musculoskeletal:     Cervical back: Normal range of motion and neck supple. No rigidity or tenderness.  Skin:    General: Skin is warm and dry.     Capillary Refill: Capillary refill takes less than 2 seconds.  Neurological:     General: No focal deficit present.     Mental Status: He is alert and oriented to person, place, and time.  Psychiatric:        Mood and Affect: Mood normal.        Behavior: Behavior normal.        Thought Content: Thought content normal.        Judgment: Judgment normal.   Assessment/Plan: 82 year old male with a recent episode of sepsis of unknown origin.  There was a question at some point in time of appendicitis but clinically his history was not consistent with appendicitis.  Never had received any surgical intervention.  He has recovered well.  Repeat images showing no acute intra-abdominal pathology. No further intervention or w/u indicated at this time.  I spent 30 minutes in this encounter including personally reviewing imaging studies, coordinating her care, placing orders and performing appropriate documentation   Caroleen Hamman, MD [place] Surgeon

## 2022-11-04 DIAGNOSIS — Z85828 Personal history of other malignant neoplasm of skin: Secondary | ICD-10-CM | POA: Diagnosis not present

## 2022-11-04 DIAGNOSIS — D225 Melanocytic nevi of trunk: Secondary | ICD-10-CM | POA: Diagnosis not present

## 2022-11-04 DIAGNOSIS — Z08 Encounter for follow-up examination after completed treatment for malignant neoplasm: Secondary | ICD-10-CM | POA: Diagnosis not present

## 2022-11-04 DIAGNOSIS — D485 Neoplasm of uncertain behavior of skin: Secondary | ICD-10-CM | POA: Diagnosis not present

## 2022-11-04 DIAGNOSIS — L739 Follicular disorder, unspecified: Secondary | ICD-10-CM | POA: Diagnosis not present

## 2022-11-04 DIAGNOSIS — D2272 Melanocytic nevi of left lower limb, including hip: Secondary | ICD-10-CM | POA: Diagnosis not present

## 2022-11-04 DIAGNOSIS — D2262 Melanocytic nevi of left upper limb, including shoulder: Secondary | ICD-10-CM | POA: Diagnosis not present

## 2022-11-04 DIAGNOSIS — L821 Other seborrheic keratosis: Secondary | ICD-10-CM | POA: Diagnosis not present

## 2022-11-04 DIAGNOSIS — D2261 Melanocytic nevi of right upper limb, including shoulder: Secondary | ICD-10-CM | POA: Diagnosis not present

## 2022-11-04 DIAGNOSIS — L570 Actinic keratosis: Secondary | ICD-10-CM | POA: Diagnosis not present

## 2022-11-22 DIAGNOSIS — H4032X4 Glaucoma secondary to eye trauma, left eye, indeterminate stage: Secondary | ICD-10-CM | POA: Diagnosis not present

## 2022-11-28 DIAGNOSIS — Z961 Presence of intraocular lens: Secondary | ICD-10-CM | POA: Diagnosis not present

## 2022-11-28 DIAGNOSIS — H27122 Anterior dislocation of lens, left eye: Secondary | ICD-10-CM | POA: Diagnosis not present

## 2022-11-28 DIAGNOSIS — H353131 Nonexudative age-related macular degeneration, bilateral, early dry stage: Secondary | ICD-10-CM | POA: Diagnosis not present

## 2022-11-28 DIAGNOSIS — H4032X4 Glaucoma secondary to eye trauma, left eye, indeterminate stage: Secondary | ICD-10-CM | POA: Diagnosis not present

## 2023-04-20 DIAGNOSIS — J90 Pleural effusion, not elsewhere classified: Secondary | ICD-10-CM | POA: Diagnosis not present

## 2023-04-20 DIAGNOSIS — R0781 Pleurodynia: Secondary | ICD-10-CM | POA: Diagnosis not present

## 2023-04-21 DIAGNOSIS — E7800 Pure hypercholesterolemia, unspecified: Secondary | ICD-10-CM | POA: Diagnosis not present

## 2023-04-21 DIAGNOSIS — I10 Essential (primary) hypertension: Secondary | ICD-10-CM | POA: Diagnosis not present

## 2023-04-21 DIAGNOSIS — Z125 Encounter for screening for malignant neoplasm of prostate: Secondary | ICD-10-CM | POA: Diagnosis not present

## 2023-04-28 DIAGNOSIS — I10 Essential (primary) hypertension: Secondary | ICD-10-CM | POA: Diagnosis not present

## 2023-04-28 DIAGNOSIS — Z125 Encounter for screening for malignant neoplasm of prostate: Secondary | ICD-10-CM | POA: Diagnosis not present

## 2023-04-28 DIAGNOSIS — R7301 Impaired fasting glucose: Secondary | ICD-10-CM | POA: Diagnosis not present

## 2023-04-28 DIAGNOSIS — E785 Hyperlipidemia, unspecified: Secondary | ICD-10-CM | POA: Diagnosis not present

## 2023-04-28 DIAGNOSIS — Z0000 Encounter for general adult medical examination without abnormal findings: Secondary | ICD-10-CM | POA: Diagnosis not present

## 2023-04-28 DIAGNOSIS — D649 Anemia, unspecified: Secondary | ICD-10-CM | POA: Diagnosis not present

## 2023-05-24 DIAGNOSIS — S62306A Unspecified fracture of fifth metacarpal bone, right hand, initial encounter for closed fracture: Secondary | ICD-10-CM | POA: Diagnosis not present

## 2023-05-26 DIAGNOSIS — H353131 Nonexudative age-related macular degeneration, bilateral, early dry stage: Secondary | ICD-10-CM | POA: Diagnosis not present

## 2023-06-02 DIAGNOSIS — H27122 Anterior dislocation of lens, left eye: Secondary | ICD-10-CM | POA: Diagnosis not present

## 2023-06-02 DIAGNOSIS — H353131 Nonexudative age-related macular degeneration, bilateral, early dry stage: Secondary | ICD-10-CM | POA: Diagnosis not present

## 2023-06-02 DIAGNOSIS — Z961 Presence of intraocular lens: Secondary | ICD-10-CM | POA: Diagnosis not present

## 2023-06-02 DIAGNOSIS — H4032X4 Glaucoma secondary to eye trauma, left eye, indeterminate stage: Secondary | ICD-10-CM | POA: Diagnosis not present

## 2023-06-05 DIAGNOSIS — M25551 Pain in right hip: Secondary | ICD-10-CM | POA: Diagnosis not present

## 2023-06-05 DIAGNOSIS — S62306A Unspecified fracture of fifth metacarpal bone, right hand, initial encounter for closed fracture: Secondary | ICD-10-CM | POA: Diagnosis not present

## 2023-06-09 DIAGNOSIS — I10 Essential (primary) hypertension: Secondary | ICD-10-CM | POA: Diagnosis not present

## 2023-06-09 DIAGNOSIS — I2510 Atherosclerotic heart disease of native coronary artery without angina pectoris: Secondary | ICD-10-CM | POA: Diagnosis not present

## 2023-06-09 DIAGNOSIS — E782 Mixed hyperlipidemia: Secondary | ICD-10-CM | POA: Diagnosis not present

## 2023-07-08 DIAGNOSIS — S62306A Unspecified fracture of fifth metacarpal bone, right hand, initial encounter for closed fracture: Secondary | ICD-10-CM | POA: Diagnosis not present

## 2023-07-28 DIAGNOSIS — R7301 Impaired fasting glucose: Secondary | ICD-10-CM | POA: Diagnosis not present

## 2023-07-28 DIAGNOSIS — D649 Anemia, unspecified: Secondary | ICD-10-CM | POA: Diagnosis not present

## 2023-08-20 DIAGNOSIS — Z85828 Personal history of other malignant neoplasm of skin: Secondary | ICD-10-CM | POA: Diagnosis not present

## 2023-08-20 DIAGNOSIS — D2272 Melanocytic nevi of left lower limb, including hip: Secondary | ICD-10-CM | POA: Diagnosis not present

## 2023-08-20 DIAGNOSIS — D2261 Melanocytic nevi of right upper limb, including shoulder: Secondary | ICD-10-CM | POA: Diagnosis not present

## 2023-08-20 DIAGNOSIS — L570 Actinic keratosis: Secondary | ICD-10-CM | POA: Diagnosis not present

## 2023-08-20 DIAGNOSIS — D225 Melanocytic nevi of trunk: Secondary | ICD-10-CM | POA: Diagnosis not present

## 2023-08-20 DIAGNOSIS — D2262 Melanocytic nevi of left upper limb, including shoulder: Secondary | ICD-10-CM | POA: Diagnosis not present

## 2023-11-21 ENCOUNTER — Other Ambulatory Visit: Payer: Self-pay

## 2023-11-21 ENCOUNTER — Emergency Department
Admission: EM | Admit: 2023-11-21 | Discharge: 2023-11-21 | Disposition: A | Discharge status: Home / Self Care | Attending: Emergency Medicine | Admitting: Emergency Medicine

## 2023-11-21 DIAGNOSIS — R1013 Epigastric pain: Secondary | ICD-10-CM | POA: Diagnosis not present

## 2023-11-21 DIAGNOSIS — Z7982 Long term (current) use of aspirin: Secondary | ICD-10-CM | POA: Diagnosis not present

## 2023-11-21 DIAGNOSIS — K920 Hematemesis: Secondary | ICD-10-CM | POA: Diagnosis not present

## 2023-11-21 DIAGNOSIS — K921 Melena: Secondary | ICD-10-CM | POA: Diagnosis not present

## 2023-11-21 DIAGNOSIS — K253 Acute gastric ulcer without hemorrhage or perforation: Secondary | ICD-10-CM | POA: Insufficient documentation

## 2023-11-21 DIAGNOSIS — R110 Nausea: Secondary | ICD-10-CM | POA: Diagnosis not present

## 2023-11-21 DIAGNOSIS — R1010 Upper abdominal pain, unspecified: Secondary | ICD-10-CM | POA: Diagnosis not present

## 2023-11-21 DIAGNOSIS — R109 Unspecified abdominal pain: Secondary | ICD-10-CM | POA: Diagnosis present

## 2023-11-21 LAB — COMPREHENSIVE METABOLIC PANEL WITH GFR
ALT: 13 U/L (ref 0–44)
AST: 19 U/L (ref 15–41)
Albumin: 4.1 g/dL (ref 3.5–5.0)
Alkaline Phosphatase: 67 U/L (ref 38–126)
Anion gap: 11 (ref 5–15)
BUN: 15 mg/dL (ref 8–23)
CO2: 24 mmol/L (ref 22–32)
Calcium: 9 mg/dL (ref 8.9–10.3)
Chloride: 105 mmol/L (ref 98–111)
Creatinine, Ser: 1.44 mg/dL — ABNORMAL HIGH (ref 0.61–1.24)
GFR, Estimated: 49 mL/min — ABNORMAL LOW (ref >60)
Glucose, Bld: 104 mg/dL — ABNORMAL HIGH (ref 70–99)
Potassium: 3.3 mmol/L — ABNORMAL LOW (ref 3.5–5.1)
Sodium: 140 mmol/L (ref 135–145)
Total Bilirubin: 1.1 mg/dL (ref 0.0–1.2)
Total Protein: 7.5 g/dL (ref 6.5–8.1)

## 2023-11-21 LAB — CBC
HCT: 41.9 % (ref 39.0–52.0)
Hemoglobin: 14.4 g/dL (ref 13.0–17.0)
MCH: 31.4 pg (ref 26.0–34.0)
MCHC: 34.4 g/dL (ref 30.0–36.0)
MCV: 91.3 fL (ref 80.0–100.0)
Platelets: 261 10*3/uL (ref 150–400)
RBC: 4.59 MIL/uL (ref 4.22–5.81)
RDW: 14.2 % (ref 11.5–15.5)
WBC: 5.3 10*3/uL (ref 4.0–10.5)
nRBC: 0 % (ref 0.0–0.2)

## 2023-11-21 LAB — URINALYSIS, ROUTINE W REFLEX MICROSCOPIC
Bilirubin Urine: NEGATIVE
Glucose, UA: NEGATIVE mg/dL
Ketones, ur: NEGATIVE mg/dL
Leukocytes,Ua: NEGATIVE
Nitrite: NEGATIVE
Protein, ur: 30 mg/dL — AB
RBC / HPF: >50 RBC/hpf (ref 0–5)
Specific Gravity, Urine: 1.019 (ref 1.005–1.030)
pH: 5 (ref 5.0–8.0)

## 2023-11-21 LAB — TYPE AND SCREEN
ABO/RH(D): O NEG
Antibody Screen: NEGATIVE

## 2023-11-21 LAB — LIPASE, BLOOD: Lipase: 37 U/L (ref 11–51)

## 2023-11-21 MED ORDER — SUCRALFATE 1 G PO TABS: 1.0000 g | ORAL_TABLET | Freq: Four times a day (QID) | ORAL | 0 refills | Status: AC

## 2023-11-21 MED ORDER — OMEPRAZOLE MAGNESIUM 20 MG PO TBEC: 40.0000 mg | DELAYED_RELEASE_TABLET | Freq: Every day | ORAL | 0 refills | Status: AC

## 2023-11-21 NOTE — ED Provider Notes (Addendum)
 [facility] EMERGENCY DEPARTMENT AT [facility] Provider Note   CSN: [id number] Arrival date & time: 11/21/23  1217     History  Chief Complaint  Patient presents with   Abdominal Pain   Melena    Chad Park. is a 83 y.o. male presents to the emergency department for evaluation of 1 week of gnawing abdominal pain with 1 episode of vomiting a few days ago.  He describes slight streaks of red in his vomit.  He has had no nausea or vomiting since.  He has had a couple episodes of dark tarry stools daily for the last few days.  He only has abdominal pain when he eats.  Denies any nausea or vomiting.  No chest pain.  He denies any recent alcohol use or NSAID use.  He takes 81 mg of aspirin  daily.  Has had colonoscopy/upper endoscopy 5 years ago.  No history of gastric ulcer disease.  HPI     Home Medications Prior to Admission medications   Medication Sig Start Date End Date Taking? Authorizing Provider  omeprazole  (PRILOSEC  OTC) 20 MG tablet Take 2 tablets (40 mg total) by mouth daily. 11/21/23 12/21/23 Yes Coralyn Derry, PA-C  sucralfate  (CARAFATE ) 1 g tablet Take 1 tablet (1 g total) by mouth 4 (four) times daily. 11/21/23  Yes Coralyn Derry, PA-C  amLODipine (NORVASC) 5 MG tablet Take 5 mg by mouth daily.    [provider]  aspirin  81 MG tablet Take 81 mg by mouth daily.    [provider]  brimonidine -timolol  (COMBIGAN ) 0.2-0.5 % ophthalmic solution Place 1 drop into the left eye every 12 (twelve) hours. 02/18/22   [provider]  Cholecalciferol (VITAMIN D) 2000 units CAPS Take 2,000 Units by mouth daily.    [provider]  Cyanocobalamin (B-12) 3000 MCG CAPS Take 3,000 mcg by mouth daily.    [provider]  dorzolamide  (TRUSOPT ) 2 % ophthalmic solution Place 1 drop into the left eye 2 (two) times daily. 12/14/21   [provider]  ibuprofen (ADVIL) 200 MG tablet Take 200 mg by mouth every 6 (six) hours as  needed.    [provider]  omeprazole  (PRILOSEC ) 20 MG capsule Take 20 mg by mouth daily.    [provider]  pravastatin  (PRAVACHOL ) 80 MG tablet Take 80 mg by mouth at bedtime. 04/25/22   [provider]  telmisartan (MICARDIS) 80 MG tablet Take 80 mg by mouth daily.    [provider]  triamcinolone  ointment (KENALOG ) 0.5 % Apply 1 application topically daily as needed (rash and itching).  12/27/15   [provider]      Allergies    Zocor [simvastatin] and Doxycycline    Review of Systems   Review of Systems  Physical Exam Updated Vital Signs BP (!) 153/99 (BP Location: Left Arm)   Pulse 85   Temp 98.1 F (36.7 C) (Oral)   Resp 18   SpO2 95%  Physical Exam Constitutional:      Appearance: He is well-developed.  HENT:     Head: Normocephalic and atraumatic.  Eyes:     Conjunctiva/sclera: Conjunctivae normal.  Cardiovascular:     Rate and Rhythm: Normal rate.  Pulmonary:     Effort: Pulmonary effort is normal. No respiratory distress.  Abdominal:     General: Bowel sounds are normal. There is distension (mildly).     Palpations: There is no mass.     Tenderness: There is  no abdominal tenderness. There is no right CVA tenderness, left CVA tenderness, guarding or rebound.     Hernia: No hernia is present.  Musculoskeletal:        General: Normal range of motion.     Cervical back: Normal range of motion.  Skin:    General: Skin is warm.     Findings: No rash.  Neurological:     General: No focal deficit present.     Mental Status: He is alert and oriented to person, place, and time.     Cranial Nerves: No cranial nerve deficit.  Psychiatric:        Mood and Affect: Mood normal.        Behavior: Behavior normal.        Thought Content: Thought content normal.     ED Results / Procedures / Treatments   Labs (all labs ordered are listed, but only abnormal results are displayed) Labs Reviewed  COMPREHENSIVE METABOLIC  PANEL WITH GFR - Abnormal; Notable for the following components:      Result Value   Potassium 3.3 (*)    Glucose, Bld 104 (*)    Creatinine, Ser 1.44 (*)    GFR, Estimated 49 (*)    All other components within normal limits  URINALYSIS, ROUTINE W REFLEX MICROSCOPIC - Abnormal; Notable for the following components:   Color, Urine YELLOW (*)    APPearance HAZY (*)    Hgb urine dipstick LARGE (*)    Protein, ur 30 (*)    Bacteria, UA RARE (*)    All other components within normal limits  LIPASE, BLOOD  CBC  TYPE AND SCREEN    EKG None  Radiology No results found.  Procedures Procedures    Medications Ordered in ED Medications - No data to display  ED Course/ Medical Decision Making/ A&P                                 Medical Decision Making Amount and/or Complexity of Data Reviewed Labs: ordered.  Risk OTC drugs. Prescription drug management.   83 year old male with 1 week of gnawing pain along his epigastric region.  1 episode of vomiting a few days ago no definite blood.  He has not had any chest pain shortness of breath nausea or vomiting over the last few days.  He has had several days of dark tarry stools, 2 episodes a day.  His vital signs are stable.  Blood work within normal limits.  No anemia.  He has no pain or discomfort on his abdomen.  He only reports pain after he eats.  He feels well with no dizziness or lightheadedness.  We will treat with omeprazole 40 mg daily as well as sucralfate 4 times daily.  Patient will follow-up with gastroenterologist.  He will return for any increasing pain worsening symptoms or urgent changes help. Final Clinical Impression(s) / ED Diagnoses Final diagnoses:  Acute gastric ulcer without hemorrhage or perforation    Rx / DC Orders ED Discharge Orders          Ordered    omeprazole (PRILOSEC OTC) 20 MG tablet  Daily        11/21/23 1707    sucralfate (CARAFATE) 1 g tablet  4 times daily        11/21/23 1707               Coralyn Derry, PA-C 11/21/23  1711    Coralyn Derry, PA-C 11/21/23 1712    Bradler, Evan K, MD 11/21/23 [phone]

## 2023-11-21 NOTE — Discharge Instructions (Addendum)
 Please increase omeprazole to 2 tablets daily.  Take sucralfate as prescribed.  Avoid foods that may irritate your stomach by producing high levels of acid.  Call gastroenterologist office Monday to schedule follow-up.   Return to the ER for any increasing pain fevers vomiting worsening symptoms or any urgent changes in your health

## 2023-11-21 NOTE — ED Triage Notes (Signed)
 Pt to ED via POV from home. Pt reports upper abd pain, N/V/D, and black tarry stools x1 wk. Pt denies blood thinner. Pt's abdomen is distended. Pt reports pain is worse after eating. Pt seen at [facility] and sent for further evaluation.

## 2023-12-01 DIAGNOSIS — H27122 Anterior dislocation of lens, left eye: Secondary | ICD-10-CM | POA: Diagnosis not present

## 2023-12-01 DIAGNOSIS — Z961 Presence of intraocular lens: Secondary | ICD-10-CM | POA: Diagnosis not present

## 2023-12-01 DIAGNOSIS — H353131 Nonexudative age-related macular degeneration, bilateral, early dry stage: Secondary | ICD-10-CM | POA: Diagnosis not present

## 2023-12-01 DIAGNOSIS — H4032X4 Glaucoma secondary to eye trauma, left eye, indeterminate stage: Secondary | ICD-10-CM | POA: Diagnosis not present

## 2024-04-02 DIAGNOSIS — D132 Benign neoplasm of duodenum: Secondary | ICD-10-CM | POA: Diagnosis not present

## 2024-04-02 DIAGNOSIS — K279 Peptic ulcer, site unspecified, unspecified as acute or chronic, without hemorrhage or perforation: Secondary | ICD-10-CM | POA: Diagnosis not present

## 2024-04-02 DIAGNOSIS — K219 Gastro-esophageal reflux disease without esophagitis: Secondary | ICD-10-CM | POA: Diagnosis not present

## 2024-04-02 DIAGNOSIS — K449 Diaphragmatic hernia without obstruction or gangrene: Secondary | ICD-10-CM | POA: Diagnosis not present

## 2024-04-21 DIAGNOSIS — I10 Essential (primary) hypertension: Secondary | ICD-10-CM | POA: Diagnosis not present

## 2024-04-21 DIAGNOSIS — Z125 Encounter for screening for malignant neoplasm of prostate: Secondary | ICD-10-CM | POA: Diagnosis not present

## 2024-04-21 DIAGNOSIS — E785 Hyperlipidemia, unspecified: Secondary | ICD-10-CM | POA: Diagnosis not present

## 2024-04-28 DIAGNOSIS — Z1331 Encounter for screening for depression: Secondary | ICD-10-CM | POA: Diagnosis not present

## 2024-04-28 DIAGNOSIS — R140 Abdominal distension (gaseous): Secondary | ICD-10-CM | POA: Diagnosis not present

## 2024-04-28 DIAGNOSIS — I10 Essential (primary) hypertension: Secondary | ICD-10-CM | POA: Diagnosis not present

## 2024-04-28 DIAGNOSIS — N200 Calculus of kidney: Secondary | ICD-10-CM | POA: Diagnosis not present

## 2024-04-28 DIAGNOSIS — I2510 Atherosclerotic heart disease of native coronary artery without angina pectoris: Secondary | ICD-10-CM | POA: Diagnosis not present

## 2024-04-28 DIAGNOSIS — Z125 Encounter for screening for malignant neoplasm of prostate: Secondary | ICD-10-CM | POA: Diagnosis not present

## 2024-04-28 DIAGNOSIS — N1831 Chronic kidney disease, stage 3a: Secondary | ICD-10-CM | POA: Diagnosis not present

## 2024-04-28 DIAGNOSIS — E7800 Pure hypercholesterolemia, unspecified: Secondary | ICD-10-CM | POA: Diagnosis not present

## 2024-04-28 DIAGNOSIS — Z0000 Encounter for general adult medical examination without abnormal findings: Secondary | ICD-10-CM | POA: Diagnosis not present

## 2024-05-26 DIAGNOSIS — H4032X4 Glaucoma secondary to eye trauma, left eye, indeterminate stage: Secondary | ICD-10-CM | POA: Diagnosis not present

## 2024-06-01 DIAGNOSIS — Z961 Presence of intraocular lens: Secondary | ICD-10-CM | POA: Diagnosis not present

## 2024-06-01 DIAGNOSIS — H4032X4 Glaucoma secondary to eye trauma, left eye, indeterminate stage: Secondary | ICD-10-CM | POA: Diagnosis not present

## 2024-06-01 DIAGNOSIS — H353131 Nonexudative age-related macular degeneration, bilateral, early dry stage: Secondary | ICD-10-CM | POA: Diagnosis not present
